# Patient Record
Sex: Female | Born: 2000 | Race: White | Hispanic: No | Smoking: Never smoker
Health system: Southern US, Community
[De-identification: ages and names within clinical notes are randomized; demographics above are authoritative.]

## PROBLEM LIST (undated history)

## (undated) DIAGNOSIS — K219 Gastro-esophageal reflux disease without esophagitis: Secondary | ICD-10-CM

## (undated) DIAGNOSIS — M419 Scoliosis, unspecified: Secondary | ICD-10-CM

## (undated) DIAGNOSIS — F419 Anxiety disorder, unspecified: Secondary | ICD-10-CM

## (undated) DIAGNOSIS — E669 Obesity, unspecified: Secondary | ICD-10-CM

## (undated) DIAGNOSIS — R011 Cardiac murmur, unspecified: Secondary | ICD-10-CM

## (undated) DIAGNOSIS — F32A Depression, unspecified: Secondary | ICD-10-CM

## (undated) DIAGNOSIS — F329 Major depressive disorder, single episode, unspecified: Secondary | ICD-10-CM

## (undated) HISTORY — DX: Major depressive disorder, single episode, unspecified: F32.9

## (undated) HISTORY — DX: Depression, unspecified: F32.A

## (undated) HISTORY — PX: WISDOM TOOTH EXTRACTION: SHX21

## (undated) HISTORY — DX: Anxiety disorder, unspecified: F41.9

---
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
## 2018-01-24 ENCOUNTER — Other Ambulatory Visit: Payer: Self-pay | Admitting: Maternal Newborn

## 2018-01-24 ENCOUNTER — Telehealth: Payer: Self-pay | Admitting: Maternal Newborn

## 2018-01-24 DIAGNOSIS — Z308 Encounter for other contraceptive management: Secondary | ICD-10-CM

## 2018-01-24 MED ORDER — NORGESTIM-ETH ESTRAD TRIPHASIC 0.18/0.215/0.25 MG-35 MCG PO TABS: 1.0000 | ORAL_TABLET | Freq: Every day | ORAL | 12 refills | Status: DC

## 2018-01-24 NOTE — Telephone Encounter (Signed)
Patient needs refill on her birth control Trisprintec.  She needs to start her new pack on Sunday.  [facility].

## 2018-06-10 ENCOUNTER — Ambulatory Visit (INDEPENDENT_AMBULATORY_CARE_PROVIDER_SITE_OTHER): Payer: Self-pay

## 2018-06-10 DIAGNOSIS — Z23 Encounter for immunization: Secondary | ICD-10-CM

## 2018-09-16 ENCOUNTER — Ambulatory Visit (INDEPENDENT_AMBULATORY_CARE_PROVIDER_SITE_OTHER): Payer: Self-pay | Admitting: Obstetrics and Gynecology

## 2018-09-16 ENCOUNTER — Ambulatory Visit: Payer: Self-pay | Admitting: Obstetrics and Gynecology

## 2018-09-16 ENCOUNTER — Encounter: Payer: Self-pay | Admitting: Obstetrics and Gynecology

## 2018-09-16 VITALS — BP 112/74 | Ht 60.0 in | Wt 135.0 lb

## 2018-09-16 DIAGNOSIS — N3001 Acute cystitis with hematuria: Secondary | ICD-10-CM

## 2018-09-16 MED ORDER — SULFAMETHOXAZOLE-TRIMETHOPRIM 800-160 MG PO TABS: 1.0000 | ORAL_TABLET | Freq: Two times a day (BID) | ORAL | 0 refills | Status: AC

## 2018-09-16 NOTE — Progress Notes (Signed)
Obstetrics & Gynecology Office Visit   Chief Complaint  Patient presents with  . Dysuria    History of Present Illness: Urinary Tract Infection: Patient complains of burning with urination, dysuria, foul smelling urine, frequency and hematuria She has had symptoms for 1 day. Patient also complains of no other symptoms. Patient denies back pain, fever and vaginal discharge. Patient does not have a history of recurrent UTI.  Patient does not have a history of pyelonephritis.   Past Medical History:  Diagnosis Date  . Anxiety   . Depression     Past Surgical History:  Procedure Laterality Date  . CHOLECYSTECTOMY N/A 01/11/2015   Procedure: LAPAROSCOPIC CHOLECYSTECTOMY WITH INTRAOPERATIVE CHOLANGIOGRAM;  Surgeon: Earline Mayotte, MD;  Location: [facility] OR[facility];  Service: General;  Laterality: N/A;    Gynecologic History: Patient's last menstrual period was 08/26/2018.  Obstetric History: G0  Family History  Problem Relation Age of Onset  . Diabetes Mother   . Cholelithiasis Mother   . Cholelithiasis Paternal Grandfather     Social History   Socioeconomic History  . Marital status: Single    Spouse name: Not on file  . Number of children: Not on file  . Years of education: Not on file  . Highest education level: Not on file  Occupational History  . Not on file  Social Needs  . Financial resource strain: Not on file  . Food insecurity:    Worry: Not on file    Inability: Not on file  . Transportation needs:    Medical: Not on file    Non-medical: Not on file  Tobacco Use  . Smoking status: Never Smoker  . Smokeless tobacco: Never Used  Substance and Sexual Activity  . Alcohol use: No    Alcohol/week: 0.0 standard drinks  . Drug use: No  . Sexual activity: Not on file  Lifestyle  . Physical activity:    Days per week: Not on file    Minutes per session: Not on file  . Stress: Not on file  Relationships  . Social connections:    Talks on phone: Not on file   Gets together: Not on file    Attends religious service: Not on file    Active member of club or organization: Not on file    Attends meetings of clubs or organizations: Not on file    Relationship status: Not on file  . Intimate partner violence:    Fear of current or ex partner: Not on file    Emotionally abused: Not on file    Physically abused: Not on file    Forced sexual activity: Not on file  Other Topics Concern  . Not on file  Social History Narrative  . Not on file    No Known Allergies  Prior to Admission medications   Medication Sig Start Date End Date Taking? Authorizing Provider  busPIRone (BUSPAR) 15 MG tablet  06/24/18  Yes [provider]  esomeprazole (NEXIUM) 20 MG capsule Take 20 mg by mouth daily at 12 noon.   Yes [provider]  hydrOXYzine (ATARAX/VISTARIL) 25 MG tablet  06/24/18  Yes [provider]  Norgestimate-Ethinyl Estradiol Triphasic (TRI-SPRINTEC) 0.18/0.215/0.25 MG-35 MCG tablet Take 1 tablet by mouth daily. 01/24/18  Yes Oswaldo Conroy, CNM  venlafaxine XR (EFFEXOR-XR) 75 MG 24 hr capsule Take 75 mg by mouth daily with breakfast.   Yes [provider]  Norgestimate-Ethinyl Estradiol Triphasic (TRI-SPRINTEC) 0.18/0.215/0.25 MG-35 MCG tablet once daily. 03/02/15  [provider]    Review of Systems  Constitutional: Negative.  Negative for chills and fever.  HENT: Negative.   Eyes: Negative.   Respiratory: Negative.   Cardiovascular: Negative.   Gastrointestinal: Negative.   Genitourinary: Positive for dysuria, frequency and hematuria. Negative for flank pain and urgency.  Musculoskeletal: Negative.  Negative for back pain.  Skin: Negative.   Neurological: Negative.   Psychiatric/Behavioral: Negative.      Physical Exam BP 112/74   Ht 5' (1.524 m)   Wt 135 lb (61.2 kg)   LMP 08/26/2018   BMI 26.37 kg/m  Patient's last menstrual period was 08/26/2018. Physical Exam Constitutional:       General: She is not in acute distress.    Appearance: Normal appearance.  HENT:     Head: Normocephalic and atraumatic.  Eyes:     General: No scleral icterus.    Conjunctiva/sclera: Conjunctivae normal.  Abdominal:     General: There is no distension.     Palpations: Abdomen is soft. There is no mass.     Tenderness: There is no abdominal tenderness. There is no right CVA tenderness, left CVA tenderness, guarding or rebound.  Neurological:     General: No focal deficit present.     Mental Status: She is alert and oriented to person, place, and time.     Cranial Nerves: No cranial nerve deficit.  Psychiatric:        Mood and Affect: Mood normal.        Behavior: Behavior normal.        Judgment: Judgment normal.    Urinalysis (pertinents): Leukocyte esterase: 3+ Nitrites: 3+ Blood: large  Assessment: 18 y.o. No obstetric history on file. female here for  1. Acute cystitis with hematuria      Plan: Problem List Items Addressed This Visit    None    Visit Diagnoses    Acute cystitis with hematuria    -  Primary   Relevant Medications   sulfamethoxazole-trimethoprim (BACTRIM DS) 800-160 MG tablet     UTI. Not enough urine for urine culture.  Will send for culture, if she does not have improvement.   Thomasene Mohair, MD 09/16/2018 1:27 PM

## 2019-01-13 ENCOUNTER — Other Ambulatory Visit: Payer: Self-pay | Admitting: Maternal Newborn

## 2019-01-13 ENCOUNTER — Telehealth: Payer: Self-pay | Admitting: Obstetrics and Gynecology

## 2019-01-13 DIAGNOSIS — Z308 Encounter for other contraceptive management: Secondary | ICD-10-CM

## 2019-01-13 MED ORDER — NORGESTIM-ETH ESTRAD TRIPHASIC 0.18/0.215/0.25 MG-35 MCG PO TABS: 1.0000 | ORAL_TABLET | Freq: Every day | ORAL | 1 refills | Status: DC

## 2019-01-13 NOTE — Telephone Encounter (Signed)
I did send a refill, but she should come in for an annual within the next 2 months if possible.

## 2019-01-13 NOTE — Telephone Encounter (Signed)
Pt needs refill on BC trisprintec to [facility]

## 2019-01-13 NOTE — Telephone Encounter (Signed)
RF or does pt need to come in for annual?

## 2019-01-13 NOTE — Progress Notes (Signed)
Refill for OCP, patient needs to be scheduled for an annual exam.

## 2019-02-27 ENCOUNTER — Other Ambulatory Visit: Payer: Self-pay | Admitting: Maternal Newborn

## 2019-02-27 DIAGNOSIS — Z308 Encounter for other contraceptive management: Secondary | ICD-10-CM

## 2019-03-06 ENCOUNTER — Other Ambulatory Visit: Payer: Self-pay

## 2019-03-06 ENCOUNTER — Other Ambulatory Visit: Payer: Self-pay | Admitting: Maternal Newborn

## 2019-03-06 DIAGNOSIS — Z308 Encounter for other contraceptive management: Secondary | ICD-10-CM

## 2019-03-06 MED ORDER — NORGESTIM-ETH ESTRAD TRIPHASIC 0.18/0.215/0.25 MG-35 MCG PO TABS: 1.0000 | ORAL_TABLET | Freq: Every day | ORAL | 0 refills | Status: DC

## 2019-03-16 ENCOUNTER — Ambulatory Visit: Payer: Self-pay | Admitting: Obstetrics and Gynecology

## 2019-03-16 NOTE — Progress Notes (Deleted)
PCP:  Lawernce PittsHuprich, Erin E, NP   No chief complaint on file.    HPI:      Ms. Sharon Kelly is a 18 y.o. No obstetric history on file. who LMP was No LMP recorded., presents today for her annual examination.  Her menses are {norm/abn:715}, lasting {number:22536} days.  Dysmenorrhea {dysmen:716}. She {does:18564} have intermenstrual bleeding.  Sex activity: {sex active:315163}.  Last Pap: N/A Hx of STDs: {STD hx:14358}  There is no FH of breast cancer. There is no FH of ovarian cancer. The patient {does:18564} do self-breast exams.  Tobacco use: {tob:20664} Alcohol use: {Alcohol:11675} No drug use.  Exercise: {exercise:31265}  She {does:18564} get adequate calcium and Vitamin D in her diet.   Past Medical History:  Diagnosis Date  . Anxiety   . Depression     Past Surgical History:  Procedure Laterality Date  . CHOLECYSTECTOMY N/A 01/11/2015   Procedure: LAPAROSCOPIC CHOLECYSTECTOMY WITH INTRAOPERATIVE CHOLANGIOGRAM;  Surgeon: Earline MayotteJeffrey W Byrnett, MD;  Location: [facility] ORS;  Service: General;  Laterality: N/A;    Family History  Problem Relation Age of Onset  . Diabetes Mother   . Cholelithiasis Mother   . Cholelithiasis Paternal Grandfather     Social History   Socioeconomic History  . Marital status: Single    Spouse name: Not on file  . Number of children: Not on file  . Years of education: Not on file  . Highest education level: Not on file  Occupational History  . Not on file  Social Needs  . Financial resource strain: Not on file  . Food insecurity    Worry: Not on file    Inability: Not on file  . Transportation needs    Medical: Not on file    Non-medical: Not on file  Tobacco Use  . Smoking status: Never Smoker  . Smokeless tobacco: Never Used  Substance and Sexual Activity  . Alcohol use: No    Alcohol/week: 0.0 standard drinks  . Drug use: No  . Sexual activity: Not on file  Lifestyle  . Physical activity    Days per week: Not on file   Minutes per session: Not on file  . Stress: Not on file  Relationships  . Social Musicianconnections    Talks on phone: Not on file    Gets together: Not on file    Attends religious service: Not on file    Active member of club or organization: Not on file    Attends meetings of clubs or organizations: Not on file    Relationship status: Not on file  . Intimate partner violence    Fear of current or ex partner: Not on file    Emotionally abused: Not on file    Physically abused: Not on file    Forced sexual activity: Not on file  Other Topics Concern  . Not on file  Social History Narrative  . Not on file    Outpatient Medications Prior to Visit  Medication Sig Dispense Refill  . busPIRone (BUSPAR) 15 MG tablet     . esomeprazole (NEXIUM) 20 MG capsule Take 20 mg by mouth daily at 12 noon.    . hydrOXYzine (ATARAX/VISTARIL) 25 MG tablet     . Norgestimate-Ethinyl Estradiol Triphasic (TRI-SPRINTEC) 0.18/0.215/0.25 MG-35 MCG tablet Take 1 tablet by mouth daily. 28 tablet 0  . venlafaxine XR (EFFEXOR-XR) 75 MG 24 hr capsule Take 75 mg by mouth daily with breakfast.     No facility-administered medications prior  to visit.       ROS:  Review of Systems BREAST: No symptoms   Objective: There were no vitals taken for this visit.   OBGyn Exam  Results: No results found for this or any previous visit (from the past 24 hour(s)).  Assessment/Plan: No diagnosis found.  No orders of the defined types were placed in this encounter.            GYN counsel {counseling:16159}     F/U  No follow-ups on file.  Bartosz Luginbill B. Bow Buntyn, PA-C 03/16/2019 9:56 AM

## 2019-04-15 ENCOUNTER — Other Ambulatory Visit: Payer: Self-pay | Admitting: Obstetrics and Gynecology

## 2019-04-15 DIAGNOSIS — Z308 Encounter for other contraceptive management: Secondary | ICD-10-CM

## 2019-04-20 ENCOUNTER — Ambulatory Visit: Payer: Self-pay | Admitting: Obstetrics and Gynecology

## 2019-04-20 ENCOUNTER — Other Ambulatory Visit: Payer: Self-pay | Admitting: Obstetrics and Gynecology

## 2019-04-20 MED ORDER — VENLAFAXINE HCL ER 75 MG PO CP24: 150.0000 mg | ORAL_CAPSULE | Freq: Every day | ORAL | 0 refills | Status: DC

## 2019-04-20 NOTE — Progress Notes (Signed)
Rx RF effexor one time to [facility]. Has psych appt 05/12/19 but psych won't RF till then. Pt had to reschedule annual today due to GI sx.

## 2019-04-27 NOTE — Progress Notes (Signed)
PCP:  Lawernce PittsHuprich, Erin E, NP   Chief Complaint  Patient presents with  . Gynecologic Exam     HPI:      Ms. Sharon Kelly is a 18 y.o. No obstetric history on file. who LMP was Patient's last menstrual period was 03/25/2019 (approximate)., presents today for her annual examination.  Her menses are regular every 28-30 days, lasting 5 days.  Dysmenorrhea mild, ibup improves sx. She does not have intermenstrual bleeding.  Sex activity: never sexually active.  Hx of STDs: none  There is a FH of breast cancer in her pat grt aunt, genetic testing not indicated. There is no FH of ovarian cancer. The patient does not do self-breast exams.  Tobacco use: The patient denies current or previous tobacco use. Alcohol use: none No drug use.  Exercise: moderately active  She does get adequate calcium but not Vitamin D in her diet.  Did 2 Gardasil injections but then recalled, so not doing the 3rd.   Pt seeing Dr. Maryruth BunKapur for anxiety/depression. Has appt in 2 wks; needed RF on effexor last wk and she wouldn't send in. I sent in 1 mo RF for her. Pt now needs RF on buspar until her appt. Takes at night to sleep.  Pt complains of episodic all over muscle aches, not improved with NSAIDs. Sx are random but usually last 4 days. Also associates migraine headache with them. Wonders if she has fibromyalgia. Has trouble staying asleep but still gets about 8-9 hrs of sleep nightly. Has appt with PCP 10/20.  Past Medical History:  Diagnosis Date  . Anxiety   . Depression     Past Surgical History:  Procedure Laterality Date  . CHOLECYSTECTOMY N/A 01/11/2015   Procedure: LAPAROSCOPIC CHOLECYSTECTOMY WITH INTRAOPERATIVE CHOLANGIOGRAM;  Surgeon: Earline MayotteJeffrey W Byrnett, MD;  Location: [facility] OR[facility];  Service: General;  Laterality: N/A;    Family History  Problem Relation Age of Onset  . Diabetes Mother   . Cholelithiasis Mother   . Cholelithiasis Paternal Grandfather     Social History   Socioeconomic History   . Marital status: Single    Spouse name: Not on file  . Number of children: Not on file  . Years of education: Not on file  . Highest education level: Not on file  Occupational History  . Not on file  Social Needs  . Financial resource strain: Not on file  . Food insecurity    Worry: Not on file    Inability: Not on file  . Transportation needs    Medical: Not on file    Non-medical: Not on file  Tobacco Use  . Smoking status: Never Smoker  . Smokeless tobacco: Never Used  Substance and Sexual Activity  . Alcohol use: No    Alcohol/week: 0.0 standard drinks  . Drug use: No  . Sexual activity: Never    Birth control/protection: Pill  Lifestyle  . Physical activity    Days per week: Not on file    Minutes per session: Not on file  . Stress: Not on file  Relationships  . Social Musicianconnections    Talks on phone: Not on file    Gets together: Not on file    Attends religious service: Not on file    Active member of club or organization: Not on file    Attends meetings of clubs or organizations: Not on file    Relationship status: Not on file  . Intimate partner violence    Fear  of current or ex partner: Not on file    Emotionally abused: Not on file    Physically abused: Not on file    Forced sexual activity: Not on file  Other Topics Concern  . Not on file  Social History Narrative  . Not on file     Current Outpatient Medications:  .  busPIRone (BUSPAR) 15 MG tablet, Take 1 tablet (15 mg total) by mouth at bedtime., Disp: 30 tablet, Rfl: 0 .  esomeprazole (NEXIUM) 20 MG capsule, Take 20 mg by mouth daily at 12 noon., Disp: , Rfl:  .  hydrOXYzine (ATARAX/VISTARIL) 25 MG tablet, , Disp: , Rfl:  .  Norgestimate-Ethinyl Estradiol Triphasic (TRI-SPRINTEC) 0.18/0.215/0.25 MG-35 MCG tablet, Take 1 tablet by mouth daily., Disp: 84 tablet, Rfl: 3 .  venlafaxine XR (EFFEXOR-XR) 75 MG 24 hr capsule, Take 2 capsules (150 mg total) by mouth daily with breakfast., Disp: 60  capsule, Rfl: 0     ROS:  Review of Systems  Constitutional: Positive for fatigue. Negative for fever and unexpected weight change.  Respiratory: Negative for cough, shortness of breath and wheezing.   Cardiovascular: Negative for chest pain, palpitations and leg swelling.  Gastrointestinal: Positive for diarrhea. Negative for blood in stool, constipation, nausea and vomiting.  Endocrine: Negative for cold intolerance, heat intolerance and polyuria.  Genitourinary: Negative for dyspareunia, dysuria, flank pain, frequency, genital sores, hematuria, menstrual problem, pelvic pain, urgency, vaginal bleeding, vaginal discharge and vaginal pain.  Musculoskeletal: Positive for arthralgias. Negative for back pain, joint swelling and myalgias.  Skin: Negative for rash.  Neurological: Negative for dizziness, syncope, light-headedness, numbness and headaches.  Hematological: Negative for adenopathy.  Psychiatric/Behavioral: Positive for agitation and decreased concentration. Negative for confusion, sleep disturbance and suicidal ideas. The patient is not nervous/anxious.   BREAST: No symptoms   Objective: BP 120/80   Ht 5\' 1"  (1.549 m)   Wt 151 lb (68.5 kg)   LMP 03/25/2019 (Approximate)   BMI 28.53 kg/m    Physical Exam Constitutional:      Appearance: She is well-developed.  Neck:     Musculoskeletal: Normal range of motion.  Pulmonary:     Effort: Pulmonary effort is normal.  Musculoskeletal: Normal range of motion.  Neurological:     General: No focal deficit present.     Mental Status: She is alert and oriented to person, place, and time.     Cranial Nerves: No cranial nerve deficit.     Sensory: No sensory deficit.  Skin:    General: Skin is warm.  Psychiatric:        Mood and Affect: Mood normal.        Behavior: Behavior normal.        Thought Content: Thought content normal.        Judgment: Judgment normal.     Assessment/Plan: Encounter for annual routine  gynecological examination  Encounter for surveillance of contraceptive pills - Plan: Norgestimate-Ethinyl Estradiol Triphasic (TRI-SPRINTEC) 0.18/0.215/0.25 MG-35 MCG tablet; OCP RF  Prescription refill - Plan: busPIRone (BUSPAR) 15 MG tablet; Rx RF until pt's appt with Dr. Nicolasa Ducking in a couple wks  Meds ordered this encounter  Medications  . busPIRone (BUSPAR) 15 MG tablet    Sig: Take 1 tablet (15 mg total) by mouth at bedtime.    Dispense:  30 tablet    Refill:  0    Order Specific Question:   Supervising Provider    Answer:   Gae Dry [id number]  . Norgestimate-Ethinyl Estradiol  Triphasic (TRI-SPRINTEC) 0.18/0.215/0.25 MG-35 MCG tablet    Sig: Take 1 tablet by mouth daily.    Dispense:  84 tablet    Refill:  3    Order Specific Question:   Supervising Provider    Answer:   Nadara Mustard [[id number]]             GYN counsel adequate intake of calcium and vitamin D, diet and exercise     F/U  Return in about 1 year (around 04/27/2020).  Syan Cullimore B. Woodruff Skirvin, PA-C 04/28/2019 10:48 AM

## 2019-04-28 ENCOUNTER — Encounter: Payer: Self-pay | Admitting: Obstetrics and Gynecology

## 2019-04-28 ENCOUNTER — Ambulatory Visit (INDEPENDENT_AMBULATORY_CARE_PROVIDER_SITE_OTHER): Payer: Managed Care, Other (non HMO) | Admitting: Obstetrics and Gynecology

## 2019-04-28 ENCOUNTER — Other Ambulatory Visit: Payer: Self-pay

## 2019-04-28 VITALS — BP 120/80 | Ht 61.0 in | Wt 151.0 lb

## 2019-04-28 DIAGNOSIS — Z3041 Encounter for surveillance of contraceptive pills: Secondary | ICD-10-CM

## 2019-04-28 DIAGNOSIS — Z01419 Encounter for gynecological examination (general) (routine) without abnormal findings: Secondary | ICD-10-CM

## 2019-04-28 DIAGNOSIS — Z760 Encounter for issue of repeat prescription: Secondary | ICD-10-CM

## 2019-04-28 MED ORDER — NORGESTIM-ETH ESTRAD TRIPHASIC 0.18/0.215/0.25 MG-35 MCG PO TABS: 1.0000 | ORAL_TABLET | Freq: Every day | ORAL | 3 refills | Status: DC

## 2019-04-28 MED ORDER — BUSPIRONE HCL 15 MG PO TABS: 15.0000 mg | ORAL_TABLET | Freq: Every day | ORAL | 0 refills | Status: DC

## 2019-04-28 NOTE — Patient Instructions (Signed)
I value your feedback and entrusting us with your care. If you get a [facility] patient survey, I would appreciate you taking the time to let us know about your experience today. Thank you! 

## 2019-05-16 ENCOUNTER — Other Ambulatory Visit: Payer: Self-pay | Admitting: Obstetrics and Gynecology

## 2019-05-25 ENCOUNTER — Other Ambulatory Visit: Payer: Self-pay | Admitting: Obstetrics and Gynecology

## 2019-05-25 DIAGNOSIS — Z760 Encounter for issue of repeat prescription: Secondary | ICD-10-CM

## 2019-06-08 ENCOUNTER — Ambulatory Visit (INDEPENDENT_AMBULATORY_CARE_PROVIDER_SITE_OTHER): Payer: Managed Care, Other (non HMO) | Admitting: Family Medicine

## 2019-06-08 ENCOUNTER — Other Ambulatory Visit: Payer: Self-pay

## 2019-06-08 ENCOUNTER — Encounter: Payer: Self-pay | Admitting: Family Medicine

## 2019-06-08 VITALS — BP 112/76 | HR 102 | Temp 97.5°F | Resp 16 | Ht 62.0 in | Wt 155.9 lb

## 2019-06-08 DIAGNOSIS — F419 Anxiety disorder, unspecified: Secondary | ICD-10-CM

## 2019-06-08 DIAGNOSIS — R5383 Other fatigue: Secondary | ICD-10-CM

## 2019-06-08 DIAGNOSIS — F329 Major depressive disorder, single episode, unspecified: Secondary | ICD-10-CM

## 2019-06-08 DIAGNOSIS — Z114 Encounter for screening for human immunodeficiency virus [HIV]: Secondary | ICD-10-CM

## 2019-06-08 DIAGNOSIS — Z1159 Encounter for screening for other viral diseases: Secondary | ICD-10-CM | POA: Diagnosis not present

## 2019-06-08 DIAGNOSIS — M791 Myalgia, unspecified site: Secondary | ICD-10-CM

## 2019-06-08 DIAGNOSIS — Z13220 Encounter for screening for lipoid disorders: Secondary | ICD-10-CM

## 2019-06-08 NOTE — Progress Notes (Signed)
Name: Sharon Kelly   MRN: [id number]    DOB: Oct 07, 2000   Date:06/08/2019       Progress Note  Subjective  Chief Complaint  Chief Complaint  Patient presents with  . Establish Care  . Depression  . Anxiety    HPI  Pt presents to establish care and for the following:  Depression and Anxiety: She has been seeing Dr. Maryruth Bun, but does not want to return - had started in October 2018.  She has been taking effexor 150mg  XR, buspar 15mg  BID, and hydroxyzine QHS PRN.  Has tried melatonin, CBD and still has trouble waking up in the middle of the night.  Prior psychiatric medications include Lexapro. She requests referral to new psychiatrist today to discuss her dog being certified as a . She denies SI/HI, PHQ9 is elevated/positive today. Depression screen PHQ 2/9 06/08/2019  Decreased Interest 1  Down, Depressed, Hopeless 1  PHQ - 2 Score 2  Altered sleeping 3  Tired, decreased energy 1  Change in appetite 0  Feeling bad or failure about yourself  0  Trouble concentrating 0  Moving slowly or fidgety/restless 1  PHQ-9 Score 7  Difficult doing work/chores Not difficult at all   Myalgias: She notes that she has had ongoing muscle pain.  She has talked to 4/9 with GYN about this and was told to keep a log, which she has done and brings in today - she notes that she initially had high pain when she was out of her psychiatric medications and had significant nausea and pain.  She restarted her meds and the pain did decrease for a week or so, but then came back up; nausea is still occasional (she thinks this is more diet and/or migraine related).  There is no real pattern that she has noticed aside from weather changes.  She notes that her pain started in high school - initially thought to be related to dancing, when she stopped dance, her pain has continued.  She has done some research and thinks she may have FMS.  Patient Active Problem List   Diagnosis Date  Noted  . Eructation 07/13/2015  . Nausea 03/02/2015    Past Surgical History:  Procedure Laterality Date  . CHOLECYSTECTOMY N/A 01/11/2015   Procedure: LAPAROSCOPIC CHOLECYSTECTOMY WITH INTRAOPERATIVE CHOLANGIOGRAM;  Surgeon: 03/04/2015, MD;  Location: [facility] ORS;  Service: General;  Laterality: N/A;    Family History  Problem Relation Age of Onset  . Diabetes Mother   . Cholelithiasis Mother   . Cholelithiasis Paternal Grandfather     Social History   Socioeconomic History  . Marital status: Single    Spouse name: Not on file  . Number of children: Not on file  . Years of education: Not on file  . Highest education level: Not on file  Occupational History  . Occupation: fulltrime student  Social Needs  . Financial resource strain: Not hard at all  . Food insecurity    Worry: Never true    Inability: Never true  . Transportation needs    Medical: No    Non-medical: No  Tobacco Use  . Smoking status: Never Smoker  . Smokeless tobacco: Never Used  Substance and Sexual Activity  . Alcohol use: No    Alcohol/week: 0.0 standard drinks  . Drug use: No  . Sexual activity: Never    Partners: Male    Birth control/protection: Pill  Lifestyle  . Physical activity  Days per week: 2 days    Minutes per session: 30 min  . Stress: Not at all  Relationships  . Social connections    Talks on phone: More than three times a week    Gets together: Never    Attends religious service: More than 4 times per year    Active member of club or organization: No    Attends meetings of clubs or organizations: Never    Relationship status: Never married  . Intimate partner violence    Fear of current or ex partner: No    Emotionally abused: No    Physically abused: No    Forced sexual activity: No  Other Topics Concern  . Not on file  Social History Narrative   [employer] Full time Student will transfer to [employer] in 2021 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     Current Outpatient  Medications:  .  busPIRone (BUSPAR) 15 MG tablet, Take 1 tablet (15 mg total) by mouth at bedtime., Disp: 30 tablet, Rfl: 0 .  esomeprazole (NEXIUM) 20 MG capsule, Take 20 mg by mouth daily at 12 noon., Disp: , Rfl:  .  hydrOXYzine (ATARAX/VISTARIL) 25 MG tablet, , Disp: , Rfl:  .  Norgestimate-Ethinyl Estradiol Triphasic (TRI-SPRINTEC) 0.18/0.215/0.25 MG-35 MCG tablet, Take 1 tablet by mouth daily., Disp: 84 tablet, Rfl: 3 .  venlafaxine XR (EFFEXOR-XR) 75 MG 24 hr capsule, Take 2 capsules (150 mg total) by mouth daily with breakfast., Disp: 60 capsule, Rfl: 0  No Known Allergies  I personally reviewed active problem list, medication list, allergies, health maintenance, notes from last encounter, lab results with the patient/caregiver today.   ROS  Ten systems reviewed and is negative except as mentioned in HPI  Objective  Vitals:   06/08/19 1123  BP: 112/76  Pulse: (!) 102  Resp: 16  Temp: (!) 97.5 F (36.4 C)  TempSrc: Temporal  SpO2: 95%  Weight: 155 lb 14.4 oz (70.7 kg)  Height: 5\' 2"  (1.575 m)   Body mass index is 28.51 kg/m.  Physical Exam  Constitutional: Patient appears well-developed and well-nourished. No distress.  HENT: Head: Normocephalic and atraumatic.  Eyes: Conjunctivae and EOM are normal. No scleral icterus.   Neck: Normal range of motion. Neck supple. No JVD present. Cardiovascular: Normal rate, regular rhythm and normal heart sounds.  No murmur heard. No BLE edema. Pulmonary/Chest: Effort normal and breath sounds normal. No respiratory distress. Musculoskeletal: Normal range of motion, no joint effusions. No gross deformities Neurological: Pt is alert and oriented to person, place, and time. No cranial nerve deficit. Coordination, balance, strength, speech and gait are normal.  Skin: Skin is warm and dry. No rash noted. No erythema.  Psychiatric: Patient has a normal mood and affect. behavior is normal. Judgment and thought content normal.  No  results found for this or any previous visit (from the past 72 hour(s)).  PHQ2/9: Depression screen PHQ 2/9 06/08/2019  Decreased Interest 1  Down, Depressed, Hopeless 1  PHQ - 2 Score 2  Altered sleeping 3  Tired, decreased energy 1  Change in appetite 0  Feeling bad or failure about yourself  0  Trouble concentrating 0  Moving slowly or fidgety/restless 1  PHQ-9 Score 7  Difficult doing work/chores Not difficult at all   PHQ-2/9 Result is positive.    Fall Risk: Fall Risk  06/08/2019  Falls in the past year? 0  Number falls in past yr: 0  Injury with Fall? 0  Follow up Falls evaluation completed  Assessment & Plan  1. Anxiety and depression - Will try to obtain new psychiatrist for her treatment at this time - Ambulatory referral to Psychiatry  2. Myalgia - Will evaluate for inflammatory disorders and rheumatologic disorders.  If these are excluded, will proceed with FMS as potential diagnosis; may consider cymbalta as an option for care in the future. - CBC with Differential/Platelet - COMPLETE METABOLIC PANEL WITH GFR - Thyroid Panel With TSH - C-reactive protein - Sedimentation rate - ANA,IFA RA Diag Pnl w/rflx Tit/Patn  3. Fatigue, unspecified type - CBC with Differential/Platelet - COMPLETE METABOLIC PANEL WITH GFR - Thyroid Panel With TSH - C-reactive protein - Sedimentation rate - ANA,IFA RA Diag Pnl w/rflx Tit/Patn  4. Need for hepatitis C screening test - Hepatitis C antibody  5. Screening for HIV without presence of risk factors - HIV Antibody (routine testing w rflx)  6. Lipid screening - Lipid panel

## 2019-06-08 NOTE — Patient Instructions (Signed)
Cymbalta and Amitriptyline - 2 medications that can sometimes help with FMS. Look these up.  We will wait on your labs prior to starting anything new, but we may want to consider these at some point.

## 2019-06-10 ENCOUNTER — Other Ambulatory Visit: Payer: Self-pay | Admitting: Family Medicine

## 2019-06-10 DIAGNOSIS — R768 Other specified abnormal immunological findings in serum: Secondary | ICD-10-CM

## 2019-06-10 DIAGNOSIS — R5383 Other fatigue: Secondary | ICD-10-CM

## 2019-06-10 DIAGNOSIS — M791 Myalgia, unspecified site: Secondary | ICD-10-CM

## 2019-06-10 LAB — COMPLETE METABOLIC PANEL WITH GFR
AG Ratio: 1.3 (calc) (ref 1.0–2.5)
ALT: 18 U/L (ref 6–29)
AST: 16 U/L (ref 10–30)
Albumin: 3.9 g/dL (ref 3.6–5.1)
Alkaline phosphatase (APISO): 55 U/L (ref 31–125)
BUN/Creatinine Ratio: 9 (calc) (ref 6–22)
BUN: 6 mg/dL — ABNORMAL LOW (ref 7–25)
CO2: 27 mmol/L (ref 20–32)
Calcium: 9 mg/dL (ref 8.6–10.2)
Chloride: 105 mmol/L (ref 98–110)
Creat: 0.67 mg/dL (ref 0.50–1.10)
GFR, Est African American: 147 mL/min/{1.73_m2} (ref >=60)
GFR, Est Non African American: 127 mL/min/{1.73_m2} (ref >=60)
Globulin: 3 g/dL (calc) (ref 1.9–3.7)
Glucose, Bld: 97 mg/dL (ref 65–99)
Potassium: 4.3 mmol/L (ref 3.5–5.3)
Sodium: 138 mmol/L (ref 135–146)
Total Bilirubin: 0.2 mg/dL (ref 0.2–1.2)
Total Protein: 6.9 g/dL (ref 6.1–8.1)

## 2019-06-10 LAB — THYROID PANEL WITH TSH
Free Thyroxine Index: 2.2 (ref 1.4–3.8)
T3 Uptake: 21 % — ABNORMAL LOW (ref 22–35)
T4, Total: 10.4 ug/dL (ref 5.3–11.7)
TSH: 1.42 mIU/L

## 2019-06-10 LAB — ANA,IFA RA DIAG PNL W/RFLX TIT/PATN
Anti Nuclear Antibody (ANA): POSITIVE — AB
Cyclic Citrullin Peptide Ab: <16 UNITS
Rheumatoid fact SerPl-aCnc: <14 IU/mL (ref <14)

## 2019-06-10 LAB — CBC WITH DIFFERENTIAL/PLATELET
Absolute Monocytes: 506 cells/uL (ref 200–950)
Basophils Absolute: 32 cells/uL (ref 0–200)
Basophils Relative: 0.5 %
Eosinophils Absolute: 77 cells/uL (ref 15–500)
Eosinophils Relative: 1.2 %
HCT: 33.8 % — ABNORMAL LOW (ref 35.0–45.0)
Hemoglobin: 10.7 g/dL — ABNORMAL LOW (ref 11.7–15.5)
Lymphs Abs: 2976 cells/uL (ref 850–3900)
MCH: 24.3 pg — ABNORMAL LOW (ref 27.0–33.0)
MCHC: 31.7 g/dL — ABNORMAL LOW (ref 32.0–36.0)
MCV: 76.6 fL — ABNORMAL LOW (ref 80.0–100.0)
MPV: 11.4 fL (ref 7.5–12.5)
Monocytes Relative: 7.9 %
Neutro Abs: 2810 cells/uL (ref 1500–7800)
Neutrophils Relative %: 43.9 %
Platelets: 292 10*3/uL (ref 140–400)
RBC: 4.41 10*6/uL (ref 3.80–5.10)
RDW: 14.5 % (ref 11.0–15.0)
Total Lymphocyte: 46.5 %
WBC: 6.4 10*3/uL (ref 3.8–10.8)

## 2019-06-10 LAB — ANTI-NUCLEAR AB-TITER (ANA TITER): ANA Titer 1: 1:40 {titer} — ABNORMAL HIGH

## 2019-06-10 LAB — C-REACTIVE PROTEIN: CRP: 3.3 mg/L (ref <8.0)

## 2019-06-10 LAB — LIPID PANEL
Cholesterol: 169 mg/dL (ref <200)
HDL: 54 mg/dL (ref >=50)
LDL Cholesterol (Calc): 93 mg/dL (calc)
Non-HDL Cholesterol (Calc): 115 mg/dL (calc) (ref <130)
Total CHOL/HDL Ratio: 3.1 (calc) (ref <5.0)
Triglycerides: 121 mg/dL (ref <150)

## 2019-06-10 LAB — HEPATITIS C ANTIBODY
Hepatitis C Ab: NONREACTIVE
SIGNAL TO CUT-OFF: 0.03 (ref <1.00)

## 2019-06-10 LAB — HIV ANTIBODY (ROUTINE TESTING W REFLEX): HIV 1&2 Ab, 4th Generation: NONREACTIVE

## 2019-06-10 LAB — SEDIMENTATION RATE: Sed Rate: 14 mm/h (ref 0–20)

## 2019-06-22 ENCOUNTER — Ambulatory Visit (INDEPENDENT_AMBULATORY_CARE_PROVIDER_SITE_OTHER): Payer: Managed Care, Other (non HMO)

## 2019-06-22 ENCOUNTER — Other Ambulatory Visit: Payer: Self-pay

## 2019-06-22 DIAGNOSIS — Z23 Encounter for immunization: Secondary | ICD-10-CM

## 2019-07-01 NOTE — Progress Notes (Signed)
Virtual Visit via Video Note  I connected with Sharon Kelly on 07/15/19 at  8:15 AM EST by a video enabled telemedicine application and verified that I am speaking with the correct person using two identifiers.  Location: Patient: Home Provider: Clinic  This service was conducted via virtual visit.  Both audio and visual tools were used.  The patient was located at home. I was located in my office.  Consent was obtained prior to the virtual visit and is aware of possible charges through their insurance for this visit.  The patient is an established patient.  Dr. Estanislado Pandy, MD conducted the virtual visit and Hazel Sams, PA-C acted as scribe during the service.  Office staff helped with scheduling follow up visits after the service was conducted.     I discussed the limitations of evaluation and management by telemedicine and the availability of in person appointments. The patient expressed understanding and agreed to proceed.  CC: Generalized myalgias  History of Present Illness: Patient is a 18 year old female with a past medical history of positive ANA, myalgia, and fatigue.  According to patient she started having pain all over her body about 3 to 4 years ago.  She states she used to dance multiple days at the time.  For the last 2 years she has not been dancing but there has been no change in her pain.  She describes pain mostly in the upper back and thoracic region in her legs.  She also describes pain in her hips which she points to trochanteric area.  She denies any discomfort in the cervical spine or thoracic spine.  She states she has no difficulty reaching her toes.  She has been suffering from insomnia, anxiety and depression for the last 4 years.  She states despite being on BuSpar hydroxyzine and melatonin she cannot sleep well.  She is establishing with a new psychiatrist and will be switching to Cymbalta from Effexor.  She states she is still continues to be under a lot of stress.   She gives history of diarrhea and constipation.  She also gives history of gastroesophageal reflux.  She states she was seen by gastroenterologist in the past and was advised treatment for gastroesophageal reflux.  She states she was never given a diagnosis of IBS.  She denies any urinary frequency.  There is no history of oral ulcers, nasal ulcers, malar rash, photosensitivity, Raynaud's phenomenon, joint swelling or lymphadenopathy.  There is no family history of autoimmune disease.  Review of Systems  Constitutional: Positive for malaise/fatigue. Negative for fever.  HENT:       +Dry mouth Denies oral or nasal ulcerations   Eyes: Negative for photophobia, pain, discharge and redness.       Denies eye dryness  Respiratory: Negative for cough, shortness of breath and wheezing.   Cardiovascular: Negative for chest pain and palpitations.  Gastrointestinal: Positive for constipation, diarrhea and heartburn. Negative for blood in stool.  Genitourinary: Negative for dysuria.  Musculoskeletal: Positive for back pain (Thoracic pain), myalgias and neck pain. Negative for joint pain.       Denies joint swelling Reports morning stiffness  Skin: Negative for rash.       Denies photosensitivity Denies color changes in fingertips    Neurological: Negative for dizziness and headaches.  Psychiatric/Behavioral: Positive for depression. The patient is nervous/anxious and has insomnia.       Observations/Objective:  Investigation: Physical Exam  Constitutional: She is oriented to person, place, and  time and well-developed, well-nourished, and in no distress.  HENT:  Head: Normocephalic and atraumatic.  Eyes: Conjunctivae are normal.  Pulmonary/Chest: Effort normal.  Neurological: She is alert and oriented to person, place, and time.  Psychiatric: Mood, memory, affect and judgment normal.   Patient reports morning stiffness for 1.5 hours.   Patient reports nocturnal pain.  Difficulty  dressing/grooming: Denies Difficulty climbing stairs: Denies Difficulty getting out of chair: Denies Difficulty using hands for taps, buttons, cutlery, and/or writing: Denies   Findings:  06/08/19: ANA 1:40 NS, RF<14, CCP<16, ESR 14, CRP 3.3, TSH 1.42, T3 21, T3 10.4, Hep C-, HIV-, lipids WNL  Component     Latest Ref Rng & Units 06/08/2019  Cholesterol     <200 mg/dL 169  HDL Cholesterol     > OR = 50 mg/dL 54  Triglycerides     <150 mg/dL 121  LDL Cholesterol (Calc)     mg/dL (calc) 93  Total CHOL/HDL Ratio     <5.0 (calc) 3.1  Non-HDL Cholesterol (Calc)     <130 mg/dL (calc) 115  T3 Uptake     22 - 35 % 21 (L)  Thyroxine (T4)     5.3 - 11.7 mcg/dL 10.4  Free Thyroxine Index     1.4 - 3.8 2.2  TSH     mIU/L 1.42  Anti Nuclear Antibody (ANA)     NEGATIVE POSITIVE (A)  RA Latex Turbid.     <46 IU/mL <80  Cyclic Citrullin Peptide Ab     UNITS <16  Interpretation        Hepatitis C Ab     NON-REACTI NON-REACTIVE  SIGNAL TO CUT-OFF     <1.00 0.03  ANA Titer 1     titer 1:40 (H)  ANA Pattern 1      Nuclear, Speckled (A)  CRP     <8.0 mg/L 3.3  Sed Rate     0 - 20 mm/h 14  HIV     NON-REACTI NON-REACTIVE     Assessment and Plan: Positive ANA (antinuclear antibody): ANA 1:40 NS, ESR 14, CRP 3.3, anti-CCP <16, RF<14, TSH 1.42: She has no clinical features of autoimmune disease at this time.  We will obtain the following lab work for thoroughness.   Myalgia: Patient complains of muscle pain for the last several years.  She had lot of trapezius spasm.  She also have thoracic pain.She was advised to try taking magnesium malate 250 mg at bedtime and if she tolerates that dose she can increase to 500 mg po at bedtime.  This raises concern of most likely myofascial pain syndrome or fibromyalgia.  Need for regular exercise was emphasized.  Other fatigue: She has chronic fatigue related to insomnia.  We discussed the importance of good sleep hygiene and regular exercise.    Other insomnia: She has interrupted sleep at night but no difficulty falling asleep.  She takes Buspar 15 mg at bedtime, Hydroxyzine 50 mg at bedtime, and melatonin at bedtime.  Good sleep hygiene was discussed.   Anxiety and depression: She is followed by a psychiatrist.  She takes Cymbalta, Buspar, and Hydroxyzine as prescribed.    History of gastroesophageal reflux (GERD): She was evaluated by a gastroenterologist in the past.  She was prescribed Nexium 20 mg 1 capsule daily, but she has not been taking it recently.   Possible IBS-patient gives history of alternating diarrhea and constipation.  Use of probiotics was discussed.  Follow Up Instructions: She will follow  up in    I discussed the assessment and treatment plan with the patient. The patient was provided an opportunity to ask questions and all were answered. The patient agreed with the plan and demonstrated an understanding of the instructions.   The patient was advised to call back or seek an in-person evaluation if the symptoms worsen or if the condition fails to improve as anticipated.  I provided 40 minutes of non-face-to-face time during this encounter.   Bo Merino, MD   Scribed by-  Hazel Sams PA-C

## 2019-07-09 ENCOUNTER — Ambulatory Visit (HOSPITAL_COMMUNITY): Payer: Self-pay | Admitting: Psychiatry

## 2019-07-13 ENCOUNTER — Encounter (HOSPITAL_COMMUNITY): Payer: Self-pay

## 2019-07-13 ENCOUNTER — Encounter (HOSPITAL_COMMUNITY): Payer: Self-pay | Admitting: Psychiatry

## 2019-07-13 ENCOUNTER — Ambulatory Visit (INDEPENDENT_AMBULATORY_CARE_PROVIDER_SITE_OTHER): Payer: 59 | Admitting: Psychiatry

## 2019-07-13 ENCOUNTER — Other Ambulatory Visit: Payer: Self-pay

## 2019-07-13 DIAGNOSIS — Z760 Encounter for issue of repeat prescription: Secondary | ICD-10-CM

## 2019-07-13 DIAGNOSIS — F3342 Major depressive disorder, recurrent, in full remission: Secondary | ICD-10-CM | POA: Insufficient documentation

## 2019-07-13 DIAGNOSIS — F411 Generalized anxiety disorder: Secondary | ICD-10-CM | POA: Diagnosis not present

## 2019-07-13 DIAGNOSIS — F331 Major depressive disorder, recurrent, moderate: Secondary | ICD-10-CM | POA: Insufficient documentation

## 2019-07-13 DIAGNOSIS — F4010 Social phobia, unspecified: Secondary | ICD-10-CM | POA: Diagnosis not present

## 2019-07-13 DIAGNOSIS — F330 Major depressive disorder, recurrent, mild: Secondary | ICD-10-CM

## 2019-07-13 MED ORDER — DULOXETINE HCL 30 MG PO CPEP: ORAL_CAPSULE | ORAL | 0 refills | Status: DC

## 2019-07-13 MED ORDER — HYDROXYZINE HCL 50 MG PO TABS: 50.0000 mg | ORAL_TABLET | Freq: Every evening | ORAL | 1 refills | Status: DC | PRN

## 2019-07-13 MED ORDER — BUSPIRONE HCL 15 MG PO TABS: 15.0000 mg | ORAL_TABLET | Freq: Two times a day (BID) | ORAL | 2 refills | Status: DC

## 2019-07-13 NOTE — Progress Notes (Signed)
Psychiatric Initial Adult Assessment   Patient Identification: Sharon Kelly MRN:  [id number] Date of Evaluation:  07/13/2019 Referral Source: Raelyn Ensign FNP Chief Complaint:   Visit Diagnosis:    ICD-10-CM   1. GAD (generalized anxiety disorder)  F41.1   2. Prescription refill  Z76.0 busPIRone (BUSPAR) 15 MG tablet  3. Social anxiety disorder  F40.10   4. Major depressive disorder, recurrent episode, mild (HCC)  F33.0   Interview was conducted using WebEx teleconferencing application and I verified that I was speaking with the correct person using two identifiers. I discussed the limitations of evaluation and management by telemedicine and  the availability of in person appointments. Patient expressed understanding and agreed to proceed.  History of Present Illness:  18 yo SWF who has been treated by Dr. Nicolasa Kelly since October 20187 for major depression and anxiety disorder. Sharon Kelly reports hx of depressed mood (improved some), generalized anxiety (excessive worrying, ruminations, racing thoughts) as well as panic attacks when she had to be in public. She has no hx of mania, psychosis, suicidal ideation or psychiatric admissions., She does not abuse alcohol or street drugs. She has been on  Venlafaxine  XR 150 mg, buspirone 15 mg bid and hydroxyzine 25 mg for middle insomnia since late 2018. She reports some benefit from Effexor but is concerned about gradual weight gain. Sleep has not improved on hydroxyzine as she wakes up after 4 hours and finds it difficult to fall asleep again. Prior to Effexor she tried Lexapro (medcine her sister is on) but it was not helpful.  Patient is complaining of chronic fatigue/myalgias and has an appointment with rheumatology soon.   Associated Signs/Symptoms: Depression Symptoms:  depressed mood, fatigue, difficulty concentrating, anxiety, disturbed sleep, weight gain, increased appetite, (Hypo) Manic Symptoms:  Irritable Mood, Anxiety Symptoms:  Excessive  Worry, Social Anxiety, Psychotic Symptoms:  None PTSD Symptoms: Negative  Past Psychiatric History: see above  Previous Psychotropic Medications: Yes   Substance Abuse History in the last 12 months:  No.  Consequences of Substance Abuse: NA  Past Medical History:  Past Medical History:  Diagnosis Date  . Anxiety   . Depression     Past Surgical History:  Procedure Laterality Date  . CHOLECYSTECTOMY N/A 01/11/2015   Procedure: LAPAROSCOPIC CHOLECYSTECTOMY WITH INTRAOPERATIVE CHOLANGIOGRAM;  Surgeon: Robert Bellow, MD;  Location: [facility] ORS;  Service: General;  Laterality: N/A;    Family Psychiatric History: Reviewed.  Family History:  Family History  Problem Relation Age of Onset  . Diabetes Mother   . Cholelithiasis Mother   . Depression Mother   . Cholelithiasis Paternal Grandfather   . Depression Sister   . Anxiety disorder Sister   . Anxiety disorder Maternal Aunt   . Depression Maternal Aunt     Social History:   Social History   Socioeconomic History  . Marital status: Single    Spouse name: Not on file  . Number of children: Not on file  . Years of education: Not on file  . Highest education level: Not on file  Occupational History  . Occupation: fulltrime student  Social Needs  . Financial resource strain: Not hard at all  . Food insecurity    Worry: Never true    Inability: Never true  . Transportation needs    Medical: No    Non-medical: No  Tobacco Use  . Smoking status: Never Smoker  . Smokeless tobacco: Never Used  Substance and Sexual Activity  . Alcohol use: No  Alcohol/week: 0.0 standard drinks  . Drug use: No  . Sexual activity: Never    Partners: Male    Birth control/protection: Pill  Lifestyle  . Physical activity    Days per week: 2 days    Minutes per session: 30 min  . Stress: Not at all  Relationships  . Social connections    Talks on phone: More than three times a week    Gets together: Never    Attends  religious service: More than 4 times per year    Active member of club or organization: No    Attends meetings of clubs or organizations: Never    Relationship status: Never married  Other Topics Concern  . 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  Allergies:  No Known Allergies  Metabolic Disorder Labs: No results found for: HGBA1C, MPG No results found for: PROLACTIN Lab Results  Component Value Date   CHOL 169 06/08/2019   TRIG 121 06/08/2019   HDL 54 06/08/2019   CHOLHDL 3.1 06/08/2019   LDLCALC 93 06/08/2019   Lab Results  Component Value Date   TSH 1.42 06/08/2019    Therapeutic Level Labs: No results found for: LITHIUM No results found for: CBMZ No results found for: VALPROATE  Current Medications: Current Outpatient Medications  Medication Sig Dispense Refill  . busPIRone (BUSPAR) 15 MG tablet Take 1 tablet (15 mg total) by mouth at bedtime. 30 tablet 0  . esomeprazole (NEXIUM) 20 MG capsule Take 20 mg by mouth daily at 12 noon.    . hydrOXYzine (ATARAX/VISTARIL) 25 MG tablet     . Norgestimate-Ethinyl Estradiol Triphasic (TRI-SPRINTEC) 0.18/0.215/0.25 MG-35 MCG tablet Take 1 tablet by mouth daily. 84 tablet 3   No current facility-administered medications for this visit.      Psychiatric Specialty Exam: Review of Systems  Constitutional: Positive for malaise/fatigue.  Gastrointestinal: Positive for nausea.  Musculoskeletal: Positive for myalgias.  Psychiatric/Behavioral: Positive for depression. Substance abuse: x: GAD; Social anxiety disorder; MDD recurrent mild. The patient is nervous/anxious and has insomnia.   All other systems reviewed and are negative.   There were no vitals taken for this visit.There is no height or weight on file to calculate BMI.  General Appearance: Casual and Well Groomed  Eye Contact:  Good   Speech:  Clear and Coherent and Normal Rate  Volume:  Normal  Mood:  Anxious and Depressed  Affect:  Full Range  Thought Process:  Goal Directed and Linear  Orientation:  Full (Time, Place, and Person)  Thought Content:  Logical and Rumination  Suicidal Thoughts:  No  Homicidal Thoughts:  No  Memory:  Immediate;   Good Recent;   Good Remote;   Good  Judgement:  Good  Insight:  Good  Psychomotor Activity:  Normal  Concentration:  Concentration: Good and Attention Span: Good  Recall:  Good  Fund of Knowledge:Good  Language: Good  Akathisia:  Negative  Handed:  Right  AIMS (if indicated):  not done  Assets:  Communication Skills Desire for Improvement Financial Resources/Insurance Housing Social Support Talents/Skills  ADL's:  Intact  Cognition: WNL  Sleep:  Fair   Screenings: GAD-7     [facility]  Total GAD-7 Score  8    PHQ2-9     [facility]  PHQ-2 Total Score  2  PHQ-9 Total Score  7      Assessment and Plan: 18 yo SWF who has been treated by Dr. Maryruth Bun since October 20187 for major depression and anxiety disorder. Vedha reports hx of depressed mood (improved some), generalized anxiety (excessive worrying, ruminations, racing thoughts) as well as panic attacks when she had to be in public. She has no hx of mania, psychosis, suicidal ideation or psychiatric admissions., She does not abuse alcohol or street drugs. 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 She reports some benefit from Effexor but is concerned about gradual weight gain. Sleep has not improved on hydroxyzine as she wakes up after 4 hours and finds it difficult to fall asleep again.   Dx: GAD; Social anxiety disorder; MDD recurrent mild  Plan: Continue buspirone 15 mg bid, increase hydroxyzine to 50-100 mg prn insomnia;  taper Effexor off and instead start Cymbalta 30 mg x 1 week then increase to 60 mg daily. If hydroxyzine does not help with middle insomnia we will try Ambien CR next. I will refrain from a trial of trazodone, amitriptyline or doxepin out of concerned about increased risk of serotonergic syndrome with 3 pro-serotoneric medications. We will write a letter supporting patient in her desire to have an emotional support anmial (dog). Next appointment in 6 weeks. The plan was discussed with patient who had an opportunity to ask questions and these were all answered. I spend 60 minutes in direct face to face clinical contact with the patient and devoted approximately 50% of this time to explanation of diagnosis, discussion of treatment options and med education.  Magdalene Patricia, MD 11/23/20209:45 AM

## 2019-07-14 ENCOUNTER — Telehealth (HOSPITAL_COMMUNITY): Payer: Self-pay

## 2019-07-14 ENCOUNTER — Encounter (HOSPITAL_COMMUNITY): Payer: Self-pay

## 2019-07-14 NOTE — Telephone Encounter (Signed)
Medication management - telephone call with pt to follow up on need for letter to support her having a service animal.  Letter completed to support her request and mailed to patient's requested address.  Pt to call back if anything further needed.

## 2019-07-15 ENCOUNTER — Telehealth (INDEPENDENT_AMBULATORY_CARE_PROVIDER_SITE_OTHER): Payer: Managed Care, Other (non HMO) | Admitting: Rheumatology

## 2019-07-15 ENCOUNTER — Encounter: Payer: Self-pay | Admitting: Rheumatology

## 2019-07-15 ENCOUNTER — Other Ambulatory Visit: Payer: Self-pay

## 2019-07-15 DIAGNOSIS — M791 Myalgia, unspecified site: Secondary | ICD-10-CM

## 2019-07-15 DIAGNOSIS — Z8719 Personal history of other diseases of the digestive system: Secondary | ICD-10-CM

## 2019-07-15 DIAGNOSIS — R768 Other specified abnormal immunological findings in serum: Secondary | ICD-10-CM | POA: Diagnosis not present

## 2019-07-15 DIAGNOSIS — G4709 Other insomnia: Secondary | ICD-10-CM | POA: Diagnosis not present

## 2019-07-15 DIAGNOSIS — F419 Anxiety disorder, unspecified: Secondary | ICD-10-CM

## 2019-07-15 DIAGNOSIS — F329 Major depressive disorder, single episode, unspecified: Secondary | ICD-10-CM

## 2019-07-15 DIAGNOSIS — R5383 Other fatigue: Secondary | ICD-10-CM | POA: Diagnosis not present

## 2019-07-17 ENCOUNTER — Encounter: Payer: Self-pay | Admitting: Family Medicine

## 2019-07-22 ENCOUNTER — Encounter: Payer: Self-pay | Admitting: Rheumatology

## 2019-07-22 ENCOUNTER — Other Ambulatory Visit: Payer: Self-pay

## 2019-07-22 DIAGNOSIS — M791 Myalgia, unspecified site: Secondary | ICD-10-CM

## 2019-07-22 DIAGNOSIS — R5383 Other fatigue: Secondary | ICD-10-CM

## 2019-07-22 DIAGNOSIS — R768 Other specified abnormal immunological findings in serum: Secondary | ICD-10-CM

## 2019-07-29 LAB — CK: Total CK: 62 U/L (ref 29–143)

## 2019-07-29 LAB — URINALYSIS, ROUTINE W REFLEX MICROSCOPIC
Bacteria, UA: NONE SEEN /HPF
Bilirubin Urine: NEGATIVE
Glucose, UA: NEGATIVE
Hyaline Cast: NONE SEEN /LPF
Ketones, ur: NEGATIVE
Leukocytes,Ua: NEGATIVE
Nitrite: NEGATIVE
Protein, ur: NEGATIVE
RBC / HPF: NONE SEEN /HPF (ref 0–2)
Specific Gravity, Urine: 1.019 (ref 1.001–1.03)
WBC, UA: NONE SEEN /HPF (ref 0–5)
pH: 6.5 (ref 5.0–8.0)

## 2019-07-29 LAB — PROTEIN ELECTROPHORESIS, SERUM, WITH REFLEX
Albumin ELP: 3.6 g/dL — ABNORMAL LOW (ref 3.8–4.8)
Alpha 1: 0.4 g/dL — ABNORMAL HIGH (ref 0.2–0.3)
Alpha 2: 0.8 g/dL (ref 0.5–0.9)
Beta 2: 0.3 g/dL (ref 0.2–0.5)
Beta Globulin: 0.6 g/dL (ref 0.4–0.6)
Gamma Globulin: 1.3 g/dL (ref 0.8–1.7)
Total Protein: 6.9 g/dL (ref 6.1–8.1)

## 2019-07-29 LAB — RNP ANTIBODY: Ribonucleic Protein(ENA) Antibody, IgG: <1 AI

## 2019-07-29 LAB — ANTI-SCLERODERMA ANTIBODY: Scleroderma (Scl-70) (ENA) Antibody, IgG: <1 AI

## 2019-07-29 LAB — ANTI-SMITH ANTIBODY: ENA SM Ab Ser-aCnc: <1 AI

## 2019-07-29 LAB — C3 AND C4
C3 Complement: 168 mg/dL (ref 83–193)
C4 Complement: 19 mg/dL (ref 15–57)

## 2019-07-29 LAB — SJOGRENS SYNDROME-B EXTRACTABLE NUCLEAR ANTIBODY: SSB (La) (ENA) Antibody, IgG: <1 AI

## 2019-07-29 LAB — SJOGRENS SYNDROME-A EXTRACTABLE NUCLEAR ANTIBODY: SSA (Ro) (ENA) Antibody, IgG: <1 AI

## 2019-07-29 LAB — ANTI-DNA ANTIBODY, DOUBLE-STRANDED: ds DNA Ab: 1 IU/mL

## 2019-07-29 LAB — HLA-B27 ANTIGEN: HLA-B27 Antigen: POSITIVE — AB

## 2019-07-29 NOTE — Progress Notes (Signed)
SPEP consistent with acute inflammatory pattern.  HLA-B27 positive.  We will discuss lab work at new patient follow up visit.

## 2019-08-03 ENCOUNTER — Encounter: Payer: Self-pay | Admitting: Family Medicine

## 2019-08-06 ENCOUNTER — Ambulatory Visit: Payer: Managed Care, Other (non HMO) | Admitting: Family Medicine

## 2019-08-25 ENCOUNTER — Encounter: Payer: Self-pay | Admitting: Rheumatology

## 2019-08-25 NOTE — Progress Notes (Deleted)
Office Visit Note  Patient: Sharon Kelly             Date of Birth: June 10, 2001           MRN: [id number]             PCP: Doren Custard, FNP Referring: Doren Custard, FNP Visit Date: 08/27/2019 Occupation: @GUAROCC @  Subjective:  No chief complaint on file.   History of Present Illness: Sharon Kelly is a 19 y.o. female ***   Activities of Daily Living:  Patient reports morning stiffness for *** {minute/hour:19697}.   Patient {ACTIONS;DENIES/REPORTS:21021675::"Denies"} nocturnal pain.  Difficulty dressing/grooming: {ACTIONS;DENIES/REPORTS:21021675::"Denies"} Difficulty climbing stairs: {ACTIONS;DENIES/REPORTS:21021675::"Denies"} Difficulty getting out of chair: {ACTIONS;DENIES/REPORTS:21021675::"Denies"} Difficulty using hands for taps, buttons, cutlery, and/or writing: {ACTIONS;DENIES/REPORTS:21021675::"Denies"}  No Rheumatology ROS completed.   PMFS History:  Patient Active Problem List   Diagnosis Date Noted  . GAD (generalized anxiety disorder) 07/13/2019  . Social anxiety disorder 07/13/2019  . Major depressive disorder, recurrent episode, mild (HCC) 07/13/2019  . Eructation 07/13/2015  . Nausea 03/02/2015    Past Medical History:  Diagnosis Date  . Anxiety   . Depression     Family History  Problem Relation Age of Onset  . Diabetes Mother   . Cholelithiasis Mother   . Depression Mother   . Cholelithiasis Paternal Grandfather   . Depression Sister   . Anxiety disorder Sister   . Anxiety disorder Maternal Aunt   . Depression Maternal Aunt    Past Surgical History:  Procedure Laterality Date  . 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 Influenza,inj,Quad PF,6+ Mos 06/10/2018, 06/22/2019  . Tdap 06/22/2019     Objective: Vital Signs: There were no vitals taken for this visit.   Physical Exam   Musculoskeletal Exam: ***  CDAI Exam: CDAI Score: -- Patient Global: --; Provider Global: -- Swollen: --; Tender: -- Joint Exam 08/27/2019   No joint exam has been documented for this visit   There is currently no information documented on the homunculus. Go to the Rheumatology activity and complete the homunculus joint exam.  Investigation: No additional findings.  Imaging: No results found.  Recent Labs: Lab Results  Component Value Date   WBC 6.4 06/08/2019   HGB 10.7 (L) 06/08/2019   PLT 292 06/08/2019   NA 138 06/08/2019   K 4.3 06/08/2019   CL 105 06/08/2019   CO2 27 06/08/2019   GLUCOSE 97 06/08/2019   BUN 6 (L) 06/08/2019   CREATININE 0.67 06/08/2019   BILITOT 0.2 06/08/2019   ALKPHOS 67 03/02/2015   AST 16 06/08/2019   ALT 18 06/08/2019   PROT 6.9 07/27/2019   ALBUMIN 4.9 03/02/2015   CALCIUM 9.0 06/08/2019   GFRAA 147 06/08/2019    Speciality Comments: No specialty comments available.  Procedures:  No procedures performed Allergies: Patient has no known allergies.   Assessment / Plan:     Visit Diagnoses: No diagnosis found.  Orders: No orders of the defined types were placed in this encounter.  No orders of the defined types were placed in this encounter.   Face-to-face time spent with patient was *** minutes. Greater than 50% of time was spent  in counseling and coordination of care.  Follow-Up Instructions: No follow-ups on file.   Ofilia Neas, PA-C  Note - This record has been created using Dragon software.  Chart creation errors have been sought, but may not always  have been located. Such creation errors do not reflect on  the standard of medical care.

## 2019-08-27 ENCOUNTER — Ambulatory Visit: Payer: 59 | Admitting: Rheumatology

## 2019-08-28 ENCOUNTER — Ambulatory Visit (HOSPITAL_COMMUNITY): Payer: 59 | Admitting: Psychiatry

## 2019-08-31 ENCOUNTER — Ambulatory Visit (INDEPENDENT_AMBULATORY_CARE_PROVIDER_SITE_OTHER): Payer: 59 | Admitting: Psychiatry

## 2019-08-31 ENCOUNTER — Other Ambulatory Visit: Payer: Self-pay

## 2019-08-31 DIAGNOSIS — Z760 Encounter for issue of repeat prescription: Secondary | ICD-10-CM | POA: Diagnosis not present

## 2019-08-31 DIAGNOSIS — F4010 Social phobia, unspecified: Secondary | ICD-10-CM | POA: Diagnosis not present

## 2019-08-31 DIAGNOSIS — F411 Generalized anxiety disorder: Secondary | ICD-10-CM | POA: Diagnosis not present

## 2019-08-31 DIAGNOSIS — F330 Major depressive disorder, recurrent, mild: Secondary | ICD-10-CM | POA: Diagnosis not present

## 2019-08-31 MED ORDER — DULOXETINE HCL 60 MG PO CPEP: 60.0000 mg | ORAL_CAPSULE | Freq: Every day | ORAL | 1 refills | Status: DC

## 2019-08-31 MED ORDER — BUSPIRONE HCL 15 MG PO TABS: 15.0000 mg | ORAL_TABLET | Freq: Two times a day (BID) | ORAL | 5 refills | Status: DC

## 2019-08-31 MED ORDER — HYDROXYZINE HCL 50 MG PO TABS: 50.0000 mg | ORAL_TABLET | Freq: Every evening | ORAL | 5 refills | Status: DC | PRN

## 2019-08-31 NOTE — Progress Notes (Signed)
BH MD/PA/NP OP Progress Note  08/31/2019 3:16 PM Jilliam A Godeaux  MRN:  [id number] Interview was conducted using WebEx teleconferencing application and I verified that I was speaking with the correct person using two identifiers. I discussed the limitations of evaluation and management by telemedicine and  the availability of in person appointments. Patient expressed understanding and agreed to proceed.  Chief Complaint: More depressed lately.   HPI: 19 yo SWF who has been treated by Dr. Maryruth Bun since October 20187 for major depression and anxiety disorder. Avalie reports hx of depressed mood (improved some), generalized anxiety (excessive worrying, ruminations, racing thoughts) as well as panic attacks when she had to be in public. She has no hx of mania, psychosis, suicidal ideation or psychiatric admissions., She does not abuse alcohol or street drugs. 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 She reports some benefit from Effexor but is concerned about gradual weight gain. Sleep has improved on hydroxyzine and she has only been taking one 50 mg dose (I recommended taking two caps). We have changed Effexor to Cymbalta 60 mg and she reports further improvement in mood and no change in appetite/weight. She lost her aunt recently (cardiac arrest) and her mood declined "a little".  Visit Diagnosis:    ICD-10-CM   1. GAD (generalized anxiety disorder)  F41.1   2. Prescription refill  Z76.0 busPIRone (BUSPAR) 15 MG tablet  3. Major depressive disorder, recurrent episode, mild (HCC)  F33.0   4. Social anxiety disorder  F40.10     Past Psychiatric History: Please see intake H&P.  Past Medical History:  Past Medical History:  Diagnosis Date  . Anxiety   . Depression     Past Surgical History:  Procedure Laterality Date  . CHOLECYSTECTOMY N/A 01/11/2015   Procedure: LAPAROSCOPIC CHOLECYSTECTOMY WITH INTRAOPERATIVE  CHOLANGIOGRAM;  Surgeon: Earline Mayotte, MD;  Location: [facility] ORS;  Service: General;  Laterality: N/A;    Family Psychiatric History: Reviewed.  Family History:  Family History  Problem Relation Age of Onset  . Diabetes Mother   . Cholelithiasis Mother   . Depression Mother   . Cholelithiasis Paternal Grandfather   . Depression Sister   . Anxiety disorder Sister   . Anxiety disorder Maternal Aunt   . Depression Maternal Aunt     Social History:  Social History   Socioeconomic History  . Marital status: Single    Spouse name: Not on file  . Number of children: Not on file  . Years of education: Not on file  . Highest education level: Not on file  Occupational History  . Occupation: fulltrime student  Tobacco Use  . Smoking status: Never Smoker  . Smokeless tobacco: Never Used  Substance and Sexual Activity  . Alcohol use: No    Alcohol/week: 0.0 standard drinks  . Drug use: No  . Sexual activity: Never    Partners: Male    Birth control/protection: Pill  Other Topics Concern  . 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 Difficulty of Paying Living Expenses: Not hard at all  Food Insecurity: No Food Insecurity  . Worried About Programme researcher, broadcasting/film/video in the Last Year: Never true  . Ran Out of Food in the Last Year:  Never true  Transportation Needs: No Transportation Needs  . Lack of Transportation (Medical): No  . Lack of Transportation (Non-Medical): No  Physical Activity: Insufficiently Active  . Days of Exercise per Week: 2 days  . Minutes of Exercise per Session: 30 min  Stress: No Stress Concern Present  . Feeling of Stress : Not at all  Social Connections: Somewhat Isolated  . Frequency of Communication with Friends and Family: More than three times a week  . Frequency of Social Gatherings with Friends and  Family: Never  . Attends Religious Services: More than 4 times per year  . Active Member of Clubs or Organizations: No  . Attends Archivist Meetings: Never  . Marital Status: Never married    Allergies: No Known Allergies  Metabolic Disorder Labs: No results found for: HGBA1C, MPG No results found for: PROLACTIN Lab Results  Component Value Date   CHOL 169 06/08/2019   TRIG 121 06/08/2019   HDL 54 06/08/2019   CHOLHDL 3.1 06/08/2019   Babb 93 06/08/2019   Lab Results  Component Value Date   TSH 1.42 06/08/2019    Therapeutic Level Labs: No results found for: LITHIUM No results found for: VALPROATE No components found for:  CBMZ  Current Medications: Current Outpatient Medications  Medication Sig Dispense Refill  . busPIRone (BUSPAR) 15 MG tablet Take 1 tablet (15 mg total) by mouth 2 (two) times daily. 60 tablet 5  . DULoxetine (CYMBALTA) 60 MG capsule Take 1 capsule (60 mg total) by mouth daily. 90 capsule 1  . esomeprazole (NEXIUM) 20 MG capsule Take 20 mg by mouth daily at 12 noon.    . hydrOXYzine (ATARAX/VISTARIL) 50 MG tablet Take 1 tablet (50 mg total) by mouth at bedtime as needed and may repeat dose one time if needed (insomnia). 60 tablet 5  . Norgestimate-Ethinyl Estradiol Triphasic (TRI-SPRINTEC) 0.18/0.215/0.25 MG-35 MCG tablet Take 1 tablet by mouth daily. 84 tablet 3   No current facility-administered medications for this visit.    Psychiatric Specialty Exam: Review of Systems  Psychiatric/Behavioral: The patient is nervous/anxious.   All other systems reviewed and are negative.   There were no vitals taken for this visit.There is no height or weight on file to calculate BMI.  General Appearance: Casual and Well Groomed  Eye Contact:  Good  Speech:  Clear and Coherent and Normal Rate  Volume:  Normal  Mood:  Depressed  Affect:  Full Range  Thought Process:  Goal Directed and Linear  Orientation:  Full (Time, Place, and Person)   Thought Content: Logical   Suicidal Thoughts:  No  Homicidal Thoughts:  No  Memory:  Immediate;   Good Recent;   Good Remote;   Good  Judgement:  Good  Insight:  Good  Psychomotor Activity:  Normal  Concentration:  Concentration: Good  Recall:  Good  Fund of Knowledge: Good  Language: Good  Akathisia:  Negative  Handed:  Right  AIMS (if indicated): not done  Assets:  Communication Skills Desire for Improvement Financial Resources/Insurance [place] Talents/Skills  ADL's:  Intact  Cognition: WNL  Sleep:  Fair   Screenings: GAD-7     Office Visit from 06/08/2019 in [facility]  Total GAD-7 Score  8    PHQ2-9     Office Visit from 06/08/2019 in [facility]  PHQ-2 Total Score  2  PHQ-9 Total Score  7       Assessment and Plan:  19 yo SWF who has been treated by Dr. Maryruth Bun since October 20187 for major depression and anxiety disorder. [place] reports hx of depressed mood (improved some), generalized anxiety (excessive worrying, ruminations, racing thoughts) as well as panic attacks when she had to be in public. She has no hx of mania, psychosis, suicidal ideation or psychiatric admissions., She does not abuse alcohol or street drugs. 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 She reports some benefit from Effexor but is concerned about gradual weight gain. Sleep has improved on hydroxyzine and she has only been taking one 50 mg dose (I recommended taking two caps). We have changed Effexor to Cymbalta 60 mg and she reports further improvement in mood and no change in appetite/weight. She lost her aunt recently (cardiac arrest) and her mood declined "a little".   Dx: GAD; Social anxiety disorder; MDD recurrent mild  Plan: Continue buspirone 15 mg bid, hydroxyzine to 50 mg prn insomnia, duloxetine 60 mg daily. Next appointment in 4  months or prn. The plan was discussed with patient who had an opportunity to ask questions and these were all answered. I spend 25 minutes in videoconferencing with the patient.    Magdalene Patricia, MD 08/31/2019, 3:16 PM

## 2019-09-01 NOTE — Progress Notes (Signed)
Office Visit Note  Patient: Sharon Kelly             Date of Birth: [birth year]           MRN: [id number]             PCP: Hubbard Hartshorn, FNP Referring: Hubbard Hartshorn, FNP Visit Date: 09/03/2019 Occupation: _0 @  Subjective:  Chronic SI joint pain bilaterally   History of Present Illness: Sharon Kelly is a 19 y.o. female with history of polyarthralgia, chronic SI joint pain, and positive HLA-B27.  She continues to have chronic SI joint pain bilaterally.  She states that the pain is more severe on the right side.  She denies any symptoms of radiculopathy.  She denies any midline spinal tenderness.  She states she typically has morning stiffness in her lower back lasting about 1 hour.  She states that her back pain does not improve with activities and is typically worse in the evenings.  She has been experiencing nocturnal pain.  She is experiencing trapezius muscle tension and muscle tenderness bilaterally.  She denies any other joint pain or joint swelling at this time.  She denies any Achilles tendinitis or plantar fasciitis.  She denies any eye inflammation.  She experiences symptoms of Raynaud's in her feet.     Activities of Daily Living:  Patient reports morning stiffness for 1 hour.   Patient Reports nocturnal pain.  Difficulty dressing/grooming: Denies Difficulty climbing stairs: Denies Difficulty getting out of chair: Denies Difficulty using hands for taps, buttons, cutlery, and/or writing: Denies  Review of Systems  Constitutional: Positive for fatigue.  HENT: Positive for mouth dryness. Negative for mouth sores and nose dryness.   Eyes: Negative for pain, itching, visual disturbance and dryness.  Respiratory: Negative for cough, hemoptysis, shortness of breath, wheezing and difficulty breathing.   Cardiovascular: Negative for chest pain, palpitations, hypertension and swelling in legs/feet.  Gastrointestinal: Positive for constipation and diarrhea. Negative for  blood in stool.  Endocrine: Negative for increased urination.  Genitourinary: Negative for difficulty urinating and painful urination.  Musculoskeletal: Positive for arthralgias, joint pain and morning stiffness. Negative for joint swelling, myalgias, muscle weakness, muscle tenderness and myalgias.  Skin: Positive for color change. Negative for pallor, rash, hair loss, nodules/bumps, skin tightness, ulcers and sensitivity to sunlight.  Allergic/Immunologic: Negative for susceptible to infections.  Neurological: Positive for headaches. Negative for dizziness, numbness, memory loss and weakness.  Hematological: Negative for swollen glands.  Psychiatric/Behavioral: Positive for sleep disturbance. Negative for depressed mood and confusion. The patient is not nervous/anxious.     PMFS History:  Patient Active Problem List   Diagnosis Date Noted  . GAD (generalized anxiety disorder) 07/13/2019  . Social anxiety disorder 07/13/2019  . Major depressive disorder, recurrent episode, mild ([place]) 07/13/2019  . Eructation 07/13/2015  . Nausea 03/02/2015    Past Medical History:  Diagnosis Date  . Anxiety   . Depression     Family History  Problem Relation Age of Onset  . Diabetes Mother   . Cholelithiasis Mother   . Depression Mother   . Cholelithiasis Paternal Grandfather   . Depression Sister   . Anxiety disorder Sister   . Anxiety disorder Maternal Aunt   . Depression Maternal Aunt    Past Surgical History:  Procedure Laterality Date  . 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 Influenza,inj,Quad PF,6+ Mos 06/10/2018, 06/22/2019  . Tdap  06/22/2019     Objective: Vital Signs: BP (!) 137/92 (BP Location: Left Arm, Patient Position: Sitting, Cuff Size: Normal)   Pulse (!) 123   Resp 13   Ht 5' (1.524 m)   Wt 164 lb 12.8 oz (74.8 kg)   BMI 32.19 kg/m    Physical Exam Vitals and nursing note reviewed.  Constitutional:      Appearance: She is well-developed.  HENT:     Head: Normocephalic and atraumatic.  Eyes:     Conjunctiva/sclera: Conjunctivae normal.     Comments: No conjunctival injection noted  Cardiovascular:     Rate and Rhythm: Normal rate and regular rhythm.     Heart sounds: Normal heart sounds.  Pulmonary:     Effort: Pulmonary effort is normal.     Breath sounds: Normal breath sounds.  Abdominal:     General: Bowel sounds are normal.     Palpations: Abdomen is soft.  Musculoskeletal:     Cervical back: Normal range of motion.  Lymphadenopathy:     Cervical: No cervical adenopathy.  Skin:    General: Skin is warm and dry.     Capillary Refill: Capillary refill takes less than 2 seconds.     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  Psychiatric:        Behavior: Behavior normal.      Musculoskeletal Exam: C-spine, thoracic spine, and lumbar spine good ROM.  Tenderness over bilateral SI joints.  No midline spinal tenderness. Shoulder joints, elbow joints, wrist joints, MCPs, PIPs, and DIPs good ROM with no synovitis.  Complete fist formation bilaterally.  Hip joints good ROM with no discomfort.  Knee joints good ROM with no discomfort.  No warmth or effusion of knee joints.  Ankle joints good ROM with no discomfort.  No tenderness or inflammation of ankle joints. Mild tenderness over the plantar aspect of the right foot.   CDAI Exam: CDAI Score: - Patient Global: -; Provider Global: - Swollen: -; Tender: - Joint Exam 09/03/2019   No joint exam has been documented for this visit    There is currently no information documented on the homunculus. Go to the Rheumatology activity and complete the homunculus joint exam.  Investigation: No additional findings.  Imaging: XR Thoracic Spine 2 View  Result Date: 09/03/2019 Dextroscoliosis of the thoracic spine was noted.  No significant disc space narrowing or syndesmophytes were noted.  No facet joint arthropathy was noted. Impression: Dextroscoliosis of the thoracic spine was noted.  XR Lumbar Spine 2-3 Views  Result Date: 09/03/2019 Levoscoliosis of the lumbar spine was noted.  No significant disc space narrowing or facet joint arthropathy was noted.  No syndesmophytes were noted. Impression: Levoscoliosis of the lumbar spine.  XR Pelvis 1-2 Views  Result Date: 09/03/2019 No SI joint sclerosis or narrowing was noted.  No erosive changes were noted. Impression: Unremarkable x-ray of the SI joints.   Recent Labs: Lab Results  Component Value Date   WBC 6.4 06/08/2019   HGB 10.7 (L) 06/08/2019   PLT 292 06/08/2019   NA 138 06/08/2019   K 4.3 06/08/2019   CL 105 06/08/2019  CO2 27 06/08/2019   GLUCOSE 97 06/08/2019   BUN 6 (L) 06/08/2019   CREATININE 0.67 06/08/2019   BILITOT 0.2 06/08/2019   ALKPHOS 67 03/02/2015   AST 16 06/08/2019   ALT 18 06/08/2019   PROT 6.9 07/27/2019   ALBUMIN 4.9 03/02/2015   CALCIUM 9.0 06/08/2019   GFRAA 147 06/08/2019   July 27, 2019 SPEP consistent with acute inflammatory pattern, CK 62, ENA negative, C3-C4 normal, HLA-B27 positive, UA showed trace hemoglobin and epithelial cells (Patient was on her menstrual cycle)   Speciality Comments: No specialty comments available.  Procedures:  No procedures performed   Allergies: Patient has no known allergies.   Assessment / Plan:     Visit Diagnoses: Polyarthralgia: July 27, 2019 SPEP consistent with acute inflammatory pattern, CK 62, ENA negative, C3-C4 normal, HLA-B27 positive: Lab work from 07/27/2019 was reviewed with  the patient today.  All questions were addressed.  We discussed the the clinical associations with having a positive HLA-B27. She has no synovitis or dactylitis on exam today.  She has chronic SI joint pain bilaterally, worse on the right than left.  She has tenderness over the right SI joint on exam today.  She experiences intermittent thoracic spinal pain.  X-rays of the thoracic, lumbar spine, and pelvis were obtained today.  She has mild tenderness on the plantar aspect of the right foot.  No Achilles tendinitis noted.  No conjunctival injection noted. No history or family history of psoriasis.  She has not had any eye pain, eye dryness, photophobia, floaters.  She has chronic constipation alternating with diarrhea.  She has not had any blood in her stool recently.  She was advised to follow-up with her GI specialist in [place] to  discuss scheduling a colonoscopy in the future.  X-rays of the SI joint were unremarkable. She has levoscoliosis of the lumbar spine.  We discussed a referral to the spine and scoliosis clinic as well as proceeding with a MRI of the SI joints to assess for inflammation but she would like to discuss with her family prior to moving forward.  She will call us back with her decisions.  She will follow up in 6 weeks.   HLA B27 positive -X-rays of the thoracic, lumbar spine, and pelvis were obtained today.  Plan: XR Lumbar Spine 2-3 Views, XR Pelvis 1-2 Views, XR Thoracic Spine 2 View.  The x-rays were consistent with as shaped with a scoliosis but no disc space narrowing or syndesmophytes were noted.  Pain in thoracic spine: She experiences intermittent thoracic pain.  She has no midline spinal tenderness on exam today.  X-rays of the thoracic spine were obtained.  X-rays were consistent with scoliosis.  Chronic SI joint pain -HLA-B27 positive: She has chronic pain in bilateral SI joints.  She has tenderness of the right SI joint on exam.  X-rays of the pelvis were obtained  today.  X-rays were unremarkable.  We discussed the next step will be to order a MRI to assess for active inflammation.  She is going to discuss with her family before proceeding with a MRI.  Plan: XR Pelvis 1-2 Views.  The x-rays were unremarkable.  Chronic midline low back pain without sciatica -She has chronic lower back pain.  She has no midline spinal tenderness at this time.  She has no symptoms of radiculopathy.  X-rays of the lumbar spine were obtained today. Levoscoliosis of the lumbar spine.  We discussed a referral to the spine and scoliosis clinic,  but she would like to discuss with her family and will call us back. Plan: XR Lumbar Spine 2-3 Views  Plantar fasciitis, right: She has mild tenderness over the right plantar fascia.  Other fatigue: She has chronic fatigue secondary to insomnia.  Positive ANA (antinuclear antibody) - ANA 1: 40 speckled, ENA negative: Lab work from 07/27/2019 was reviewed with the patient today.  All questions were addressed.  She experiences intermittent symptoms of Raynaud's in her feet.  Livedo reticularis was noted on bilateral lower extremities.  She experiences mouth dryness but no eye dryness.  She has not had any recurrent or oral or nasal ulcerations.  She has not noticed any enlarged lymph nodes or recent fevers.  She has not had any facial rashes.  No malar rash was noted on exam today.  Raynaud's syndrome without gangrene: She experiences intermittent symptoms of Raynaud's in both feet.  No ulcerations or signs of gangrene were noted.  Livedo reticularis: Bilateral lower extremities.  History of diarrhea - Alternating with constipation.  She denies any blood in her stool or abdominal pain at this time.  She was advised to follow-up with her GI specialist in [place] to discuss possibly scheduling a colonoscopy.  History of gastroesophageal reflux (GERD)  Myalgia - History of myalgia here for many years.  Other insomnia: She has very interrupted  sleep at night.   Anxiety and depression    Orders: Orders Placed This Encounter  Procedures  . XR Lumbar Spine 2-3 Views  . XR Pelvis 1-2 Views  . XR Thoracic Spine 2 View   No orders of the defined types were placed in this encounter.   Face-to-face time spent with patient was 40 minutes. Greater than 50% of time was spent in counseling and coordination of care.  Follow-Up Instructions: Return in about 6 weeks (around 10/15/2019) for HLA-B27+.  Hazel Sams, PA-C   I examined and evaluated the patient with Hazel Sams PA.  Patient had tenderness over her thoracic, lumbar spine and paraspinal region.  She also had tenderness over the SI joints more prominent on the right side.  No synovitis was noted on the examination.  The x-rays were consistent with thoracolumbar scoliosis.  No SI joint sclerosis was noted.  She is HLA-B27 positive we discussed possibility of obtaining MRI to look further.  She will let us know if she wants to proceed with that.  We also offered referral to scoliosis center which she will discuss with her mother and will let us know.  The plan of care was discussed as noted above.  Bo Merino, MD  Note - This record has been created using Editor, commissioning.  Chart creation errors have been sought, but may not always  have been located. Such creation errors do not reflect on  the standard of medical care.

## 2019-09-03 ENCOUNTER — Other Ambulatory Visit: Payer: Self-pay

## 2019-09-03 ENCOUNTER — Ambulatory Visit: Payer: Self-pay

## 2019-09-03 ENCOUNTER — Encounter: Payer: Self-pay | Admitting: Rheumatology

## 2019-09-03 ENCOUNTER — Ambulatory Visit (INDEPENDENT_AMBULATORY_CARE_PROVIDER_SITE_OTHER): Payer: Managed Care, Other (non HMO) | Admitting: Rheumatology

## 2019-09-03 VITALS — BP 137/92 | HR 123 | Resp 13 | Ht 60.0 in | Wt 164.8 lb

## 2019-09-03 DIAGNOSIS — M2550 Pain in unspecified joint: Secondary | ICD-10-CM

## 2019-09-03 DIAGNOSIS — M722 Plantar fascial fibromatosis: Secondary | ICD-10-CM

## 2019-09-03 DIAGNOSIS — R7689 Other specified abnormal immunological findings in serum: Secondary | ICD-10-CM

## 2019-09-03 DIAGNOSIS — M545 Low back pain: Secondary | ICD-10-CM

## 2019-09-03 DIAGNOSIS — Z87898 Personal history of other specified conditions: Secondary | ICD-10-CM

## 2019-09-03 DIAGNOSIS — Z8719 Personal history of other diseases of the digestive system: Secondary | ICD-10-CM

## 2019-09-03 DIAGNOSIS — G8929 Other chronic pain: Secondary | ICD-10-CM

## 2019-09-03 DIAGNOSIS — M533 Sacrococcygeal disorders, not elsewhere classified: Secondary | ICD-10-CM | POA: Diagnosis not present

## 2019-09-03 DIAGNOSIS — M546 Pain in thoracic spine: Secondary | ICD-10-CM | POA: Diagnosis not present

## 2019-09-03 DIAGNOSIS — I7300 Raynaud's syndrome without gangrene: Secondary | ICD-10-CM

## 2019-09-03 DIAGNOSIS — R768 Other specified abnormal immunological findings in serum: Secondary | ICD-10-CM

## 2019-09-03 DIAGNOSIS — Z1589 Genetic susceptibility to other disease: Secondary | ICD-10-CM

## 2019-09-03 DIAGNOSIS — F419 Anxiety disorder, unspecified: Secondary | ICD-10-CM

## 2019-09-03 DIAGNOSIS — R231 Pallor: Secondary | ICD-10-CM

## 2019-09-03 DIAGNOSIS — F329 Major depressive disorder, single episode, unspecified: Secondary | ICD-10-CM

## 2019-09-03 DIAGNOSIS — R5383 Other fatigue: Secondary | ICD-10-CM

## 2019-09-03 DIAGNOSIS — G4709 Other insomnia: Secondary | ICD-10-CM

## 2019-09-03 DIAGNOSIS — M791 Myalgia, unspecified site: Secondary | ICD-10-CM

## 2019-09-17 ENCOUNTER — Other Ambulatory Visit (HOSPITAL_COMMUNITY): Payer: Self-pay | Admitting: Psychiatry

## 2019-09-23 ENCOUNTER — Encounter: Payer: Self-pay | Admitting: Family Medicine

## 2019-09-29 ENCOUNTER — Other Ambulatory Visit: Payer: Self-pay | Admitting: Emergency Medicine

## 2019-09-29 DIAGNOSIS — R110 Nausea: Secondary | ICD-10-CM

## 2019-10-18 NOTE — Progress Notes (Deleted)
Office Visit Note  Patient: Sharon Kelly             Date of Birth: 26-Apr-2001           MRN: [id number]             PCP: Hubbard Hartshorn, FNP Referring: Hubbard Hartshorn, FNP Visit Date: 10/22/2019 Occupation: '@GUAROCC'$ @  Subjective:  No chief complaint on file.   History of Present Illness: Sharon Kelly is a 19 y.o. female ***   Activities of Daily Living:  Patient reports morning stiffness for *** {minute/hour:19697}.   Patient {ACTIONS;DENIES/REPORTS:21021675::"Denies"} nocturnal pain.  Difficulty dressing/grooming: {ACTIONS;DENIES/REPORTS:21021675::"Denies"} Difficulty climbing stairs: {ACTIONS;DENIES/REPORTS:21021675::"Denies"} Difficulty getting out of chair: {ACTIONS;DENIES/REPORTS:21021675::"Denies"} Difficulty using hands for taps, buttons, cutlery, and/or writing: {ACTIONS;DENIES/REPORTS:21021675::"Denies"}  No Rheumatology ROS completed.   PMFS History:  Patient Active Problem List   Diagnosis Date Noted  . GAD (generalized anxiety disorder) 07/13/2019  . Social anxiety disorder 07/13/2019  . Major depressive disorder, recurrent episode, mild ([place]) 07/13/2019  . Eructation 07/13/2015  . Nausea 03/02/2015    Past Medical History:  Diagnosis Date  . Anxiety   . Depression     Family History  Problem Relation Age of Onset  . Diabetes Mother   . Cholelithiasis Mother   . Depression Mother   . Cholelithiasis Paternal Grandfather   . Depression Sister   . Anxiety disorder Sister   . Anxiety disorder Maternal Aunt   . Depression Maternal Aunt    Past Surgical History:  Procedure Laterality Date  . 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 Influenza,inj,Quad PF,6+ Mos 06/10/2018, 06/22/2019  . Tdap 06/22/2019     Objective: Vital Signs: There were no vitals taken for this visit.   Physical Exam   Musculoskeletal Exam: ***  CDAI Exam: CDAI Score: - Patient Global: -; Provider Global: - Swollen: -; Tender: - Joint Exam 10/22/2019   No joint exam has been documented for this visit   There is currently no information documented on the homunculus. Go to the Rheumatology activity and complete the homunculus joint exam.  Investigation: No additional findings.  Imaging: No results found.  Recent Labs: Lab Results  Component Value Date   WBC 6.4 06/08/2019   HGB 10.7 (L) 06/08/2019   PLT 292 06/08/2019   NA 138 06/08/2019   K 4.3 06/08/2019   CL 105 06/08/2019   CO2 27 06/08/2019   GLUCOSE 97 06/08/2019   BUN 6 (L) 06/08/2019   CREATININE 0.67 06/08/2019   BILITOT 0.2 06/08/2019   ALKPHOS 67 03/02/2015   AST 16 06/08/2019   ALT 18 06/08/2019   PROT 6.9 07/27/2019   ALBUMIN 4.9 03/02/2015   CALCIUM 9.0 06/08/2019   GFRAA 147 06/08/2019   July 27, 2019 UA showed trace hemoglobin, SPEP consistent with acute inflammatory pattern, C3-C4 normal, ENA negative, CK 62, HLA-B27 positive  06/08/19: ANA 1:40 NS, RF<14, CCP<16, ESR 14, CRP 3.3, TSH 1.42, T3 21, T3 10.4, Hep C-, HIV-, lipids WNL Speciality Comments: No specialty comments available.  Procedures:  No procedures performed Allergies: Patient has no known allergies.   Assessment / Plan:     Visit Diagnoses: No diagnosis found.  Orders: No orders of the defined types were placed in this encounter.  No orders of the defined types were placed in this encounter.   Face-to-face time spent with patient was *** minutes. Greater than 50% of time was spent in counseling and coordination of care.  Follow-Up Instructions: No follow-ups on file.   Bo Merino, MD  Note - This record has been created using Radio producer.  Chart creation errors have been sought, but may not always  have been located. Such creation errors do not reflect on  the standard of medical care.

## 2019-10-22 ENCOUNTER — Ambulatory Visit: Payer: Managed Care, Other (non HMO) | Admitting: Rheumatology

## 2019-12-30 ENCOUNTER — Other Ambulatory Visit: Payer: Self-pay

## 2019-12-30 ENCOUNTER — Telehealth (INDEPENDENT_AMBULATORY_CARE_PROVIDER_SITE_OTHER): Payer: 59 | Admitting: Psychiatry

## 2019-12-30 DIAGNOSIS — F3341 Major depressive disorder, recurrent, in partial remission: Secondary | ICD-10-CM | POA: Diagnosis not present

## 2019-12-30 DIAGNOSIS — Z760 Encounter for issue of repeat prescription: Secondary | ICD-10-CM

## 2019-12-30 DIAGNOSIS — F4010 Social phobia, unspecified: Secondary | ICD-10-CM | POA: Diagnosis not present

## 2019-12-30 DIAGNOSIS — F411 Generalized anxiety disorder: Secondary | ICD-10-CM

## 2019-12-30 MED ORDER — BUSPIRONE HCL 15 MG PO TABS: 15.0000 mg | ORAL_TABLET | Freq: Two times a day (BID) | ORAL | 5 refills | Status: DC

## 2019-12-30 MED ORDER — HYDROXYZINE HCL 50 MG PO TABS: 50.0000 mg | ORAL_TABLET | Freq: Every evening | ORAL | 5 refills | Status: DC | PRN

## 2019-12-30 MED ORDER — DULOXETINE HCL 60 MG PO CPEP: 60.0000 mg | ORAL_CAPSULE | Freq: Every day | ORAL | 1 refills | Status: DC

## 2019-12-30 NOTE — Progress Notes (Signed)
BH MD/PA/NP OP Progress Note  12/30/2019 3:08 PM Sharon Kelly  MRN:  [id number] Interview was conducted using videoconferencing application and I verified that I was speaking with the correct person using two identifiers. I discussed the limitations of evaluation and management by telemedicine and  the availability of in person appointments. Patient expressed understanding and agreed to proceed.  Chief Complaint: "I am doing well".  HPI: 19 yo SWF with hx of depressed mood (improved some), generalized anxiety (excessive worrying, ruminations, racing thoughts) as well as panic attacks when she had to be in public. She has no hx of mania, psychosis, suicidal ideation or psychiatric admissions., She does not abuse alcohol or street drugs. 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 She reports some benefit from Effexor but was concerned about gradual weight gain. Sleep has improved on hydroxyzine and she has only been taking one 50 mg dose (I recommended taking two caps). We have changed Effexor to Cymbalta 60 mg and she reports further improvement in mood and no change in appetite/weight. She will return to school [employer]) on August 18. She has not received COVID vaccine yet and is "on the fence" about getting one. She just returned from a trip with her BF to [place].  Visit Diagnosis:    ICD-10-CM   1. GAD (generalized anxiety disorder)  F41.1   2. Social anxiety disorder  F40.10   3. Major depressive disorder, recurrent episode, in partial remission (HCC)  F33.41   4. Prescription refill  Z76.0 busPIRone (BUSPAR) 15 MG tablet    Past Psychiatric History: Please see intake H&P.  Past Medical History:  Past Medical History:  Diagnosis Date  . Anxiety   . Depression     Past Surgical History:  Procedure Laterality Date  . CHOLECYSTECTOMY N/A 01/11/2015   Procedure: LAPAROSCOPIC CHOLECYSTECTOMY WITH  INTRAOPERATIVE CHOLANGIOGRAM;  Surgeon: Earline Mayotte, MD;  Location: [facility] ORS;  Service: General;  Laterality: N/A;    Family Psychiatric History: Reviewed.  Family History:  Family History  Problem Relation Age of Onset  . Diabetes Mother   . Cholelithiasis Mother   . Depression Mother   . Cholelithiasis Paternal Grandfather   . Depression Sister   . Anxiety disorder Sister   . Anxiety disorder Maternal Aunt   . Depression Maternal Aunt     Social History:  Social History   Socioeconomic History  . Marital status: Single    Spouse name: Not on file  . Number of children: Not on file  . Years of education: Not on file  . Highest education level: Not on file  Occupational History  . Occupation: fulltrime student  Tobacco Use  . Smoking status: Never Smoker  . Smokeless tobacco: Never Used  Substance and Sexual Activity  . Alcohol use: No    Alcohol/week: 0.0 standard drinks  . Drug use: No  . Sexual activity: Never    Partners: Male    Birth control/protection: Pill  Other Topics Concern  . 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 Difficulty of Paying Living Expenses: Not hard at all  Food Insecurity: No Food Insecurity  . Worried About Programme researcher, broadcasting/film/video in the Last Year: Never true  . Ran Out  of Food in the Last Year: Never true  Transportation Needs: No Transportation Needs  . Lack of Transportation (Medical): No  . Lack of Transportation (Non-Medical): No  Physical Activity: Insufficiently Active  . Days of Exercise per Week: 2 days  . Minutes of Exercise per Session: 30 min  Stress: No Stress Concern Present  . Feeling of Stress : Not at all  Social Connections: Somewhat Isolated  . Frequency of Communication with Friends and Family: More than three times a week  . Frequency of Social Gatherings with  Friends and Family: Never  . Attends Religious Services: More than 4 times per year  . Active Member of Clubs or Organizations: No  . Attends Archivist Meetings: Never  . Marital Status: Never married    Allergies: No Known Allergies  Metabolic Disorder Labs: No results found for: HGBA1C, MPG No results found for: PROLACTIN Lab Results  Component Value Date   CHOL 169 06/08/2019   TRIG 121 06/08/2019   HDL 54 06/08/2019   CHOLHDL 3.1 06/08/2019   [place] 93 06/08/2019   Lab Results  Component Value Date   TSH 1.42 06/08/2019    Therapeutic Level Labs: No results found for: LITHIUM No results found for: VALPROATE No components found for:  CBMZ  Current Medications: Current Outpatient Medications  Medication Sig Dispense Refill  . busPIRone (BUSPAR) 15 MG tablet Take 1 tablet (15 mg total) by mouth 2 (two) times daily. 60 tablet 5  . Cholecalciferol (VITAMIN D3) 50 MCG (2000 UT) TABS Take by mouth daily.    . DULoxetine (CYMBALTA) 60 MG capsule Take 1 capsule (60 mg total) by mouth daily. 90 capsule 1  . esomeprazole (NEXIUM) 20 MG capsule Take 20 mg by mouth daily at 12 noon.    . hydrOXYzine (ATARAX/VISTARIL) 50 MG tablet Take 1 tablet (50 mg total) by mouth at bedtime as needed and may repeat dose one time if needed (insomnia). 60 tablet 5  . Norgestimate-Ethinyl Estradiol Triphasic (TRI-SPRINTEC) 0.18/0.215/0.25 MG-35 MCG tablet Take 1 tablet by mouth daily. 84 tablet 3   No current facility-administered medications for this visit.    Psychiatric Specialty Exam: Review of Systems  Psychiatric/Behavioral: The patient is nervous/anxious.   All other systems reviewed and are negative.   There were no vitals taken for this visit.There is no height or weight on file to calculate BMI.  General Appearance: Casual and Well Groomed  Eye Contact:  Good  Speech:  Clear and Coherent and Normal Rate  Volume:  Normal  Mood:  Minimal anxiety.  Affect:  Full Range   Thought Process:  Goal Directed and Linear  Orientation:  Full (Time, Place, and Person)  Thought Content: Logical   Suicidal Thoughts:  No  Homicidal Thoughts:  No  Memory:  Immediate;   Good Recent;   Good Remote;   Good  Judgement:  Fair  Insight:  Fair  Psychomotor Activity:  Normal  Concentration:  Concentration: Good  Recall:  Good  Fund of Knowledge: Good  Language: Good  Akathisia:  Negative  Handed:  Right  AIMS (if indicated): not done  Assets:  Communication Skills Desire for Improvement Financial Resources/Insurance Housing Intimacy Social Support Talents/Skills Vocational/Educational  ADL's:  Intact  Cognition: WNL  Sleep:  Good   Screenings: GAD-7     [facility]2019 [facility]  Total GAD-7 Score  8    PHQ2-9     [facility]08[facility] in [facility]  PHQ-2 Total Score  2  PHQ-9 Total Score  7       Assessment and Plan: 19 yo SWF with hx of depressed mood (improved some), generalized anxiety (excessive worrying, ruminations, racing thoughts) as well as panic attacks when she had to be in public. She has no hx of mania, psychosis, suicidal ideation or psychiatric admissions., She does not abuse alcohol or street drugs. 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 She reports some benefit from Effexor but was concerned about gradual weight gain. Sleep has improved on hydroxyzine and she has only been taking one 50 mg dose (I recommended taking two caps). We have changed Effexor to Cymbalta 60 mg and she reports further improvement in mood and no change in appetite/weight. She will return to school [employer]) on August 18. She has not received COVID vaccine yet and is "on the fence" about getting one.  Dx: GAD; Social anxiety disorder; MDD recurrent in partial remission  Plan: Continue buspirone 15 mg bid, hydroxyzine to  50 mg prn insomnia, duloxetine 60 mg daily. Next appointment in 3 months or prn.The plan was discussed with patient who had an opportunity to ask questions and these were all answered. I spend19minutes in videoconferencing with the patient.    Magdalene Patricia, MD 12/30/2019, 3:08 PM

## 2020-01-19 ENCOUNTER — Telehealth (HOSPITAL_COMMUNITY): Payer: Self-pay

## 2020-01-19 NOTE — Telephone Encounter (Signed)
Patient's mom called and stated that patient is in a very depressive state and that she's concerned about her. She stated that she has a hard time getting her out of the house. I transferred mom to the fd to see if she could get a sooner appointment than the one she has on 8/12 but I don't think she could. Mom asked if you could call her at [phone] or Nevae at [phone] when you get back. I did inform mom that if it gets really bad for [facility] and she doesn't think she can hold on until you get back to please take her to the emergency room. Thank you.

## 2020-03-11 ENCOUNTER — Encounter (HOSPITAL_COMMUNITY): Payer: Self-pay

## 2020-03-24 ENCOUNTER — Other Ambulatory Visit (HOSPITAL_COMMUNITY): Payer: Self-pay | Admitting: Psychiatry

## 2020-03-31 ENCOUNTER — Telehealth (INDEPENDENT_AMBULATORY_CARE_PROVIDER_SITE_OTHER): Payer: Self-pay | Admitting: Psychiatry

## 2020-03-31 ENCOUNTER — Other Ambulatory Visit: Payer: Self-pay

## 2020-03-31 DIAGNOSIS — F411 Generalized anxiety disorder: Secondary | ICD-10-CM

## 2020-03-31 DIAGNOSIS — F4010 Social phobia, unspecified: Secondary | ICD-10-CM

## 2020-03-31 DIAGNOSIS — F331 Major depressive disorder, recurrent, moderate: Secondary | ICD-10-CM

## 2020-03-31 MED ORDER — DULOXETINE HCL 60 MG PO CPEP: 60.0000 mg | ORAL_CAPSULE | Freq: Two times a day (BID) | ORAL | 1 refills | Status: DC

## 2020-03-31 NOTE — Progress Notes (Signed)
BH MD/PA/NP OP Progress Note  03/31/2020 3:16 PM Sharon Kelly  MRN:  [id number] Interview was conducted using videoconferencing application and I verified that I was speaking with the correct person using two identifiers. I discussed the limitations of evaluation and management by telemedicine and  the availability of in person appointments. Patient expressed understanding and agreed to proceed. Patient location - home; physician - home office.  Chief Complaint: More depressed for past two months.  HPI: 19 yo SWF with hx of depressed mood (improved some), generalized anxiety (excessive worrying, ruminations, racing thoughts) as well as panic attacks when she had to be in public. She has no hx of mania, psychosis, suicidal ideation or psychiatric admissions., She does not abuse alcohol or street drugs. 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 She reports some benefit from Effexor but was concerned about gradual weight gain. Sleep has improved on hydroxyzineand she has only been taking one 50 mg dose (I recommended taking two caps). We have changed Effexor to Cymbalta 60 mg and she reported further improvement in mood and no change in appetite/weight. IN late May however her boyfriend broke up with her (wanted to fucus on studies) and that setback  Triggered increase in depression and anxiety. Furthermore, two of her family members passed away in the past in month of August bringing back sad memories. She will return to school [employer]) on August 18 but most classes will be virtual. She will live at home and commute. She has not received COVID vaccine yet and is "on the fence" about getting one.  Visit Diagnosis:    ICD-10-CM   1. Major depressive disorder, recurrent episode, moderate (HCC)  F33.1   2. GAD (generalized anxiety disorder)  F41.1   3. Social anxiety disorder  F40.10     Past Psychiatric History: Please see  intake H&P.  Past Medical History:  Past Medical History:  Diagnosis Date  . Anxiety   . Depression     Past Surgical History:  Procedure Laterality Date  . CHOLECYSTECTOMY N/A 01/11/2015   Procedure: LAPAROSCOPIC CHOLECYSTECTOMY WITH INTRAOPERATIVE CHOLANGIOGRAM;  Surgeon: Earline Mayotte, MD;  Location: [facility] ORS;  Service: General;  Laterality: N/A;    Family Psychiatric History: Reviewed.  Family History:  Family History  Problem Relation Age of Onset  . Diabetes Mother   . Cholelithiasis Mother   . Depression Mother   . Cholelithiasis Paternal Grandfather   . Depression Sister   . Anxiety disorder Sister   . Anxiety disorder Maternal Aunt   . Depression Maternal Aunt     Social History:  Social History   Socioeconomic History  . Marital status: Single    Spouse name: Not on file  . Number of children: Not on file  . Years of education: Not on file  . Highest education level: Not on file  Occupational History  . Occupation: fulltrime student  Tobacco Use  . Smoking status: Never Smoker  . Smokeless tobacco: Never Used  Vaping Use  . Vaping Use: Never used  Substance and Sexual Activity  . Alcohol use: No    Alcohol/week: 0.0 standard drinks  . Drug use: No  . Sexual activity: Never    Partners: Male    Birth control/protection: Pill  Other Topics Concern  . 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 Difficulty of Paying Living Expenses: Not hard at all  Food Insecurity: No Food Insecurity  . Worried About Programme researcher, broadcasting/film/video in the Last Year: Never true  . Ran Out of Food in the Last Year: Never true  Transportation Needs: No Transportation Needs  . Lack of Transportation (Medical): No  . Lack of Transportation (Non-Medical): No  Physical Activity: Insufficiently Active  . Days of Exercise per Week: 2  days  . Minutes of Exercise per Session: 30 min  Stress: No Stress Concern Present  . Feeling of Stress : Not at all  Social Connections: Moderately Isolated  . Frequency of Communication with Friends and Family: More than three times a week  . Frequency of Social Gatherings with Friends and Family: Never  . Attends Religious Services: More than 4 times per year  . Active Member of Clubs or Organizations: No  . Attends Banker Meetings: Never  . Marital Status: Never married    Allergies: No Known Allergies  Metabolic Disorder Labs: No results found for: HGBA1C, MPG No results found for: PROLACTIN Lab Results  Component Value Date   CHOL 169 06/08/2019   TRIG 121 06/08/2019   HDL 54 06/08/2019   CHOLHDL 3.1 06/08/2019   LDLCALC 93 06/08/2019   Lab Results  Component Value Date   TSH 1.42 06/08/2019    Therapeutic Level Labs: No results found for: LITHIUM No results found for: VALPROATE No components found for:  CBMZ  Current Medications: Current Outpatient Medications  Medication Sig Dispense Refill  . busPIRone (BUSPAR) 15 MG tablet Take 1 tablet (15 mg total) by mouth 2 (two) times daily. 60 tablet 5  . Cholecalciferol (VITAMIN D3) 50 MCG (2000 UT) TABS Take by mouth daily.    . DULoxetine (CYMBALTA) 60 MG capsule Take 1 capsule (60 mg total) by mouth 2 (two) times daily. 60 capsule 1  . esomeprazole (NEXIUM) 20 MG capsule Take 20 mg by mouth daily at 12 noon.    . hydrOXYzine (ATARAX/VISTARIL) 50 MG tablet Take 1 tablet (50 mg total) by mouth at bedtime as needed and may repeat dose one time if needed (insomnia). 60 tablet 5  . Norgestimate-Ethinyl Estradiol Triphasic (TRI-SPRINTEC) 0.18/0.215/0.25 MG-35 MCG tablet Take 1 tablet by mouth daily. 84 tablet 3   No current facility-administered medications for this visit.     Psychiatric Specialty Exam: Review of Systems  Psychiatric/Behavioral: Positive for sleep disturbance. The patient is  nervous/anxious.   All other systems reviewed and are negative.   There were no vitals taken for this visit.There is no height or weight on file to calculate BMI.  General Appearance: Casual and Well Groomed  Eye Contact:  Good  Speech:  Clear and Coherent and Normal Rate  Volume:  Normal  Mood:  Anxious and Depressed  Affect:  Congruent and Constricted  Thought Process:  Goal Directed  Orientation:  Full (Time, Place, and Person)  Thought Content: Rumination   Suicidal Thoughts:  No  Homicidal Thoughts:  No  Memory:  Immediate;   Good Recent;   Good Remote;   Good  Judgement:  Good  Insight:  Good  Psychomotor Activity:  Normal  Concentration:  Concentration: Fair  Recall:  Good  Fund of Knowledge: Good  Language: Good  Akathisia:  Negative  Handed:  Right  AIMS (if indicated): not done  Assets:  Communication Skills Desire  for Improvement Housing Physical Health Social Support Vocational/Educational  ADL's:  Intact  Cognition: WNL  Sleep:  Fair   Screenings: GAD-7     Office Visit from 06/08/2019 in [facility]  Total GAD-7 Score 8    PHQ2-9     Office Visit from 06/08/2019 in [facility]  PHQ-2 Total Score 2  PHQ-9 Total Score 7       Assessment and Plan:  19 yo SWF with hx of depressed mood (improved some), generalized anxiety (excessive worrying, ruminations, racing thoughts) as well as panic attacks when she had to be in public. She has no hx of mania, psychosis, suicidal ideation or psychiatric admissions., She does not abuse alcohol or street drugs. 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 She reports some benefit from Effexor but was concerned about gradual weight gain. Sleep has improved on hydroxyzineand she has only been taking one 50 mg dose until recently when she started taking two. We have changed Effexor to Cymbalta 60 mg and she  reported further improvement in mood and no change in appetite/weight. In late May however her boyfriend broke up with her (wanted to fucus on studies) and that setback  Triggered increase in depression and anxiety. Furthermore, two of her family members passed away in the past in month of August bringing back sad memories. She will return to school [employer]) on August 18 but most classes will be virtual. She will live at home and commute.   Dx: GAD; Social anxiety disorder; MDD recurrent moderate  Plan: Continue buspirone 15 mg bid, hydroxyzine to 50-100 mg prn insomnia, increase duloxetine to 60 mg bid.Next appointment inone month.The plan was discussed with patient who had an opportunity to ask questions and these were all answered. I spend21minutes invideoconferencingwith the patient.   Magdalene Patricia, MD 03/31/2020, 3:16 PM

## 2020-04-04 ENCOUNTER — Telehealth (HOSPITAL_COMMUNITY): Payer: Self-pay | Admitting: Psychiatry

## 2020-05-03 ENCOUNTER — Telehealth (INDEPENDENT_AMBULATORY_CARE_PROVIDER_SITE_OTHER): Payer: Self-pay | Admitting: Psychiatry

## 2020-05-03 ENCOUNTER — Other Ambulatory Visit: Payer: Self-pay

## 2020-05-03 DIAGNOSIS — Z760 Encounter for issue of repeat prescription: Secondary | ICD-10-CM

## 2020-05-03 DIAGNOSIS — F4010 Social phobia, unspecified: Secondary | ICD-10-CM

## 2020-05-03 DIAGNOSIS — F330 Major depressive disorder, recurrent, mild: Secondary | ICD-10-CM

## 2020-05-03 DIAGNOSIS — F411 Generalized anxiety disorder: Secondary | ICD-10-CM

## 2020-05-03 MED ORDER — HYDROXYZINE HCL 50 MG PO TABS: 50.0000 mg | ORAL_TABLET | Freq: Every evening | ORAL | 5 refills | Status: DC | PRN

## 2020-05-03 MED ORDER — BUSPIRONE HCL 15 MG PO TABS: 15.0000 mg | ORAL_TABLET | Freq: Two times a day (BID) | ORAL | 5 refills | Status: DC

## 2020-05-03 MED ORDER — DULOXETINE HCL 60 MG PO CPEP: 60.0000 mg | ORAL_CAPSULE | Freq: Two times a day (BID) | ORAL | 5 refills | Status: DC

## 2020-05-03 NOTE — Progress Notes (Signed)
BH MD/PA/NP OP Progress Note  05/03/2020 3:11 PM Sharon Kelly  MRN:  [id number] Interview was conducted using videoconferencing application and I verified that I was speaking with the correct person using two identifiers. I discussed the limitations of evaluation and management by telemedicine and  the availability of in person appointments. Patient expressed understanding and agreed to proceed. Patient location - home; physician - home office.  Chief Complaint: "I feel better".  HPI: 19yo SWF withhx of depressed mood (improved some), generalized anxiety (excessive worrying, ruminations, racing thoughts) as well as panic attacks when she had to be in public. She has no hx of mania, psychosis, suicidal ideation or psychiatric admissions., She does not abuse alcohol or street drugs. 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 She reports some benefit from Effexor butwas concerned about gradual weight gain. Sleep has improved on hydroxyzineand she has only been taking one 50 mg dose until recently when she started taking two. We have changed Effexor to Cymbalta 60 mg and she reported further improvement in mood and no change in appetite/weight. In late May however her boyfriend broke up with her (wanted to fucus on studies) and that setback  Triggered increase in depression and anxiety. Furthermore, two of her family members passed away in the past in month of August bringing back sad memories. She is back at [employer] in person classes 3 days a week: lives at home and commutes.    Visit Diagnosis:    ICD-10-CM   1. GAD (generalized anxiety disorder)  F41.1   2. Prescription refill  Z76.0 busPIRone (BUSPAR) 15 MG tablet  3. Major depressive disorder, recurrent episode, mild (HCC)  F33.0   4. Social anxiety disorder  F40.10     Past Psychiatric History: Please see intake H&P.  Past Medical History:  Past Medical  History:  Diagnosis Date  . Anxiety   . Depression     Past Surgical History:  Procedure Laterality Date  . CHOLECYSTECTOMY N/A 01/11/2015   Procedure: LAPAROSCOPIC CHOLECYSTECTOMY WITH INTRAOPERATIVE CHOLANGIOGRAM;  Surgeon: Earline Mayotte, MD;  Location: [facility] ORS;  Service: General;  Laterality: N/A;    Family Psychiatric History: Reviewed.  Family History:  Family History  Problem Relation Age of Onset  . Diabetes Mother   . Cholelithiasis Mother   . Depression Mother   . Cholelithiasis Paternal Grandfather   . Depression Sister   . Anxiety disorder Sister   . Anxiety disorder Maternal Aunt   . Depression Maternal Aunt     Social History:  Social History   Socioeconomic History  . Marital status: Single    Spouse name: Not on file  . Number of children: Not on file  . Years of education: Not on file  . Highest education level: Not on file  Occupational History  . Occupation: fulltrime student  Tobacco Use  . Smoking status: Never Smoker  . Smokeless tobacco: Never Used  Vaping Use  . Vaping Use: Never used  Substance and Sexual Activity  . Alcohol use: No    Alcohol/week: 0.0 standard drinks  . Drug use: No  . Sexual activity: Never    Partners: Male    Birth control/protection: Pill  Other Topics Concern  . 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 Difficulty of Paying Living Expenses: Not hard at all  Food Insecurity: No Food Insecurity  . Worried About Programme researcher, broadcasting/film/video in the Last Year: Never true  . Ran Out of Food in the Last Year: Never true  Transportation Needs: No Transportation Needs  . Lack of Transportation (Medical): No  . Lack of Transportation (Non-Medical): No  Physical Activity: Insufficiently Active  . Days of Exercise per Week: 2 days  . Minutes of Exercise per Session: 30 min   Stress: No Stress Concern Present  . Feeling of Stress : Not at all  Social Connections: Moderately Isolated  . Frequency of Communication with Friends and Family: More than three times a week  . Frequency of Social Gatherings with Friends and Family: Never  . Attends Religious Services: More than 4 times per year  . Active Member of Clubs or Organizations: No  . Attends Banker Meetings: Never  . Marital Status: Never married    Allergies: No Known Allergies  Metabolic Disorder Labs: No results found for: HGBA1C, MPG No results found for: PROLACTIN Lab Results  Component Value Date   CHOL 169 06/08/2019   TRIG 121 06/08/2019   HDL 54 06/08/2019   CHOLHDL 3.1 06/08/2019   LDLCALC 93 06/08/2019   Lab Results  Component Value Date   TSH 1.42 06/08/2019    Therapeutic Level Labs: No results found for: LITHIUM No results found for: VALPROATE No components found for:  CBMZ  Current Medications: Current Outpatient Medications  Medication Sig Dispense Refill  . busPIRone (BUSPAR) 15 MG tablet Take 1 tablet (15 mg total) by mouth 2 (two) times daily. 60 tablet 5  . Cholecalciferol (VITAMIN D3) 50 MCG (2000 UT) TABS Take by mouth daily.    . DULoxetine (CYMBALTA) 60 MG capsule Take 1 capsule (60 mg total) by mouth 2 (two) times daily. 60 capsule 5  . esomeprazole (NEXIUM) 20 MG capsule Take 20 mg by mouth daily at 12 noon.    . hydrOXYzine (ATARAX/VISTARIL) 50 MG tablet Take 1 tablet (50 mg total) by mouth at bedtime as needed and may repeat dose one time if needed (insomnia). 60 tablet 5  . Norgestimate-Ethinyl Estradiol Triphasic (TRI-SPRINTEC) 0.18/0.215/0.25 MG-35 MCG tablet Take 1 tablet by mouth daily. 84 tablet 3   No current facility-administered medications for this visit.    Psychiatric Specialty Exam: Review of Systems  Psychiatric/Behavioral: The patient is nervous/anxious.   All other systems reviewed and are negative.   There were no vitals  taken for this visit.There is no height or weight on file to calculate BMI.  General Appearance: Casual, well groomed  Eye Contact:  Good  Speech:  Clear and Coherent and Normal Rate  Volume:  Normal  Mood:  Anxious  Affect:  Full Range  Thought Process:  Goal Directed  Orientation:  Full (Time, Place, and Person)  Thought Content: Logical   Suicidal Thoughts:  No  Homicidal Thoughts:  No  Memory:  Immediate;   Good Recent;   Good Remote;   Good  Judgement:  Good  Insight:  Good  Psychomotor Activity:  Normal  Concentration:  Concentration: Good  Recall:  Good  Fund of Knowledge: Good  Language: Good  Akathisia:  Negative  Handed:  Right  AIMS (if indicated): not done  Assets:  Communication Skills Desire for Improvement Financial Resources/Insurance Housing Physical Health Social Support Talents/Skills  ADL's:  Intact  Cognition: WNL  Sleep:  Good  Screenings: GAD-7     Office Visit from 06/08/2019 in [facility]  Total GAD-7 Score 8    PHQ2-9     Office Visit from 06/08/2019 in [facility]  PHQ-2 Total Score 2  PHQ-9 Total Score 7       Assessment and Plan: 19yo SWF withhx of depressed mood (improved some), generalized anxiety (excessive worrying, ruminations, racing thoughts) as well as panic attacks when she had to be in public. She has no hx of mania, psychosis, suicidal ideation or psychiatric admissions., She does not abuse alcohol or street drugs. 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 She reports some benefit from Effexor butwas concerned about gradual weight gain. Sleep has improved on hydroxyzineand she has only been taking one 50 mg dose until recently when she started taking two. We have changed Effexor to Cymbalta 60 mg and she reported further improvement in mood and no change in appetite/weight. In late May however her boyfriend  broke up with her (wanted to fucus on studies) and that setback triggered increase in depression and anxiety. Furthermore, two of her family members passed away in the past in month of August bringing back sad memories. She is back at [employer] in person classes 3 days a week: lives at home and commutes.   Dx: GAD; Social anxiety disorder; MDD recurrentmoderate  Plan: Continue buspirone 15 mg bid, hydroxyzine to 50-100 mg prn insomnia, duloxetine to 60 mg bid.Next appointment inthree months.The plan was discussed with patient who had an opportunity to ask questions and these were all answered. I spend26minutes invideoconferencingwith the patient.    Magdalene Patricia, MD 05/03/2020, 3:11 PM

## 2020-06-27 ENCOUNTER — Other Ambulatory Visit: Payer: Self-pay

## 2020-06-27 ENCOUNTER — Other Ambulatory Visit (HOSPITAL_COMMUNITY)
Admission: RE | Admit: 2020-06-27 | Discharge: 2020-06-27 | Disposition: A | Discharge status: Home / Self Care | Payer: Self-pay | Source: Ambulatory Visit | Attending: Obstetrics and Gynecology | Admitting: Obstetrics and Gynecology

## 2020-06-27 ENCOUNTER — Encounter: Payer: Self-pay | Admitting: Obstetrics and Gynecology

## 2020-06-27 ENCOUNTER — Ambulatory Visit (INDEPENDENT_AMBULATORY_CARE_PROVIDER_SITE_OTHER): Payer: BC Managed Care – PPO | Admitting: Obstetrics and Gynecology

## 2020-06-27 VITALS — BP 130/90 | Ht 60.0 in | Wt 176.0 lb

## 2020-06-27 DIAGNOSIS — Z01419 Encounter for gynecological examination (general) (routine) without abnormal findings: Secondary | ICD-10-CM

## 2020-06-27 DIAGNOSIS — Z23 Encounter for immunization: Secondary | ICD-10-CM | POA: Diagnosis not present

## 2020-06-27 DIAGNOSIS — Z124 Encounter for screening for malignant neoplasm of cervix: Secondary | ICD-10-CM

## 2020-06-27 DIAGNOSIS — Z113 Encounter for screening for infections with a predominantly sexual mode of transmission: Secondary | ICD-10-CM

## 2020-06-27 DIAGNOSIS — Z3041 Encounter for surveillance of contraceptive pills: Secondary | ICD-10-CM

## 2020-06-27 MED ORDER — NORGESTIM-ETH ESTRAD TRIPHASIC 0.18/0.215/0.25 MG-35 MCG PO TABS: 1.0000 | ORAL_TABLET | Freq: Every day | ORAL | 3 refills | Status: DC

## 2020-06-27 NOTE — Progress Notes (Signed)
PCP:  Doren Custard, FNP   Chief Complaint  Patient presents with  . Gynecologic Exam  . Injections    flu     HPI:      Ms. Sharon Kelly is a 19 y.o. No obstetric history on file. who LMP was Patient's last menstrual period was 06/22/2020 (exact date)., presents today for her annual examination.  Her menses are regular every 28-30 days, lasting 4 days. Dysmenorrhea mild, ibup improves sx. She does not have intermenstrual bleeding.  Sex activity: not currently sexually active--contraception OCPs  Hx of STDs: none  There is a FH of breast cancer in her pat grt aunt, genetic testing not indicated. There is no FH of ovarian cancer. The patient does not do self-breast exams.  Tobacco use: The patient denies current or previous tobacco use. Alcohol use: none No drug use.  Exercise: moderately active  She does get adequate calcium but not Vitamin D in her diet.  Did 2 Gardasil injections but then recalled, so not doing the 3rd.   Pt seeing Dr. Hinton Dyer for anxiety/depression. Doing well, likes him a lot   Past Medical History:  Diagnosis Date  . Anxiety   . Depression     Past Surgical History:  Procedure Laterality Date  . CHOLECYSTECTOMY N/A 01/11/2015   Procedure: LAPAROSCOPIC CHOLECYSTECTOMY WITH INTRAOPERATIVE CHOLANGIOGRAM;  Surgeon: Earline Mayotte, MD;  Location: [facility] ORS;  Service: General;  Laterality: N/A;    Family History  Problem Relation Age of Onset  . Diabetes Mother   . Cholelithiasis Mother   . Depression Mother   . Cholelithiasis Paternal Grandfather   . Depression Sister   . Anxiety disorder Sister   . Anxiety disorder Maternal Aunt   . Depression Maternal Aunt     Social History   Socioeconomic History  . Marital status: Single    Spouse name: Not on file  . Number of children: Not on file  . Years of education: Not on file  . Highest education level: Not on file  Occupational History  . Occupation: fulltrime student  Tobacco  Use  . Smoking status: Never Smoker  . Smokeless tobacco: Never Used  Vaping Use  . Vaping Use: Never used  Substance and Sexual Activity  . Alcohol use: No    Alcohol/week: 0.0 standard drinks  . Drug use: No  . Sexual activity: Not Currently    Partners: Male    Birth control/protection: Pill  Other Topics Concern  . 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 Difficulty of Paying Living Expenses: Not on file  Food Insecurity:   . Worried About Programme researcher, broadcasting/film/video in the Last Year: Not on file  . Ran Out of Food in the Last Year: Not on file  Transportation Needs:   . Lack of Transportation (Medical): Not on file  . Lack of Transportation (Non-Medical): Not on file  Physical Activity:   . Days of Exercise per Week: Not on file  . Minutes of Exercise per Session: Not on file  Stress:   . Feeling of Stress : Not on file  Social Connections:   . Frequency of Communication with Friends and Family: Not on file  . Frequency of Social Gatherings with Friends and Family: Not on file  . Attends Religious Services: Not on file  .  Active Member of Clubs or Organizations: Not on file  . Attends Banker Meetings: Not on file  . Marital Status: Not on file  Intimate Partner Violence:   . Fear of Current or Ex-Partner: Not on file  . Emotionally Abused: Not on file  . Physically Abused: Not on file  . Sexually Abused: Not on file     Current Outpatient Medications:  .  busPIRone (BUSPAR) 15 MG tablet, Take 1 tablet (15 mg total) by mouth 2 (two) times daily., Disp: 60 tablet, Rfl: 5 .  DULoxetine (CYMBALTA) 60 MG capsule, Take 1 capsule (60 mg total) by mouth 2 (two) times daily., Disp: 60 capsule, Rfl: 5 .  esomeprazole (NEXIUM) 20 MG capsule, Take 20 mg by mouth daily at 12 noon., Disp: , Rfl:  .  hydrOXYzine (ATARAX/VISTARIL) 50  MG tablet, Take 1 tablet (50 mg total) by mouth at bedtime as needed and may repeat dose one time if needed (insomnia)., Disp: 60 tablet, Rfl: 5 .  Norgestimate-Ethinyl Estradiol Triphasic (TRI-SPRINTEC) 0.18/0.215/0.25 MG-35 MCG tablet, Take 1 tablet by mouth daily., Disp: 84 tablet, Rfl: 3     ROS:  Review of Systems  Constitutional: Negative for fatigue, fever and unexpected weight change.  Respiratory: Negative for cough, shortness of breath and wheezing.   Cardiovascular: Negative for chest pain, palpitations and leg swelling.  Gastrointestinal: Negative for blood in stool, constipation, diarrhea, nausea and vomiting.  Endocrine: Negative for cold intolerance, heat intolerance and polyuria.  Genitourinary: Negative for dyspareunia, dysuria, flank pain, frequency, genital sores, hematuria, menstrual problem, pelvic pain, urgency, vaginal bleeding, vaginal discharge and vaginal pain.  Musculoskeletal: Positive for arthralgias. Negative for back pain, joint swelling and myalgias.  Skin: Negative for rash.  Neurological: Negative for dizziness, syncope, light-headedness, numbness and headaches.  Hematological: Negative for adenopathy.  Psychiatric/Behavioral: Positive for agitation and decreased concentration. Negative for confusion, sleep disturbance and suicidal ideas. The patient is not nervous/anxious.   BREAST: No symptoms   Objective: BP 130/90   Ht 5' (1.524 m)   Wt 176 lb (79.8 kg)   LMP 06/22/2020 (Exact Date)   BMI 34.37 kg/m    Physical Exam Constitutional:      Appearance: She is well-developed.  Genitourinary:     Vulva, vagina, cervix, uterus, right adnexa and left adnexa normal.     No vulval lesion or tenderness noted.     No vaginal discharge, erythema or tenderness.     No cervical polyp.     Uterus is not enlarged or tender.     No right or left adnexal mass present.     Right adnexa not tender.     Left adnexa not tender.  Neck:     Thyroid: No  thyromegaly.  Cardiovascular:     Rate and Rhythm: Normal rate and regular rhythm.     Heart sounds: Normal heart sounds. No murmur heard.   Pulmonary:     Effort: Pulmonary effort is normal.     Breath sounds: Normal breath sounds.  Chest:     Breasts:        Right: No mass, nipple discharge, skin change or tenderness.        Left: No mass, nipple discharge, skin change or tenderness.  Abdominal:     Palpations: Abdomen is soft.     Tenderness: There is no abdominal tenderness. There is no guarding.  Musculoskeletal:        General: Normal range of motion.  Cervical back: Normal range of motion.  Neurological:     General: No focal deficit present.     Mental Status: She is alert and oriented to person, place, and time.     Cranial Nerves: No cranial nerve deficit.     Sensory: No sensory deficit.  Skin:    General: Skin is warm and dry.  Psychiatric:        Mood and Affect: Mood normal.        Behavior: Behavior normal.        Thought Content: Thought content normal.        Judgment: Judgment normal.  Vitals reviewed.     Assessment/Plan: Encounter for annual routine gynecological examination  Cervical cancer screening - Plan: Cytology - PAP  Screening for STD (sexually transmitted disease) - Plan: Cytology - PAP  Encounter for surveillance of contraceptive pills - Plan: Norgestimate-Ethinyl Estradiol Triphasic (TRI-SPRINTEC) 0.18/0.215/0.25 MG-35 MCG tablet; OCP RF   Meds ordered this encounter  Medications  . Norgestimate-Ethinyl Estradiol Triphasic (TRI-SPRINTEC) 0.18/0.215/0.25 MG-35 MCG tablet    Sig: Take 1 tablet by mouth daily.    Dispense:  84 tablet    Refill:  3    Order Specific Question:   Supervising Provider    Answer:   Nadara Mustard [[id number]]             GYN counsel adequate intake of calcium and vitamin D, diet and exercise     F/U  Return in about 1 year (around 06/27/2021).  Devaney Segers B. Presleigh Feldstein, PA-C 06/27/2020 1:45 PM

## 2020-06-27 NOTE — Patient Instructions (Signed)
I value your feedback and entrusting us with your care. If you get a [place] patient survey, I would appreciate you taking the time to let us know about your experience today. Thank you!  As of July 30, 2019, your lab results will be released to your MyChart immediately, before I even have a chance to see them. Please give me time to review them and contact you if there are any abnormalities. Thank you for your patience.  

## 2020-06-30 LAB — CYTOLOGY - PAP
Chlamydia: NEGATIVE
Comment: NEGATIVE
Comment: NORMAL
Diagnosis: NEGATIVE
Neisseria Gonorrhea: NEGATIVE

## 2020-08-01 ENCOUNTER — Other Ambulatory Visit: Payer: Self-pay

## 2020-08-01 ENCOUNTER — Telehealth (INDEPENDENT_AMBULATORY_CARE_PROVIDER_SITE_OTHER): Payer: BC Managed Care – PPO | Admitting: Psychiatry

## 2020-08-01 DIAGNOSIS — F411 Generalized anxiety disorder: Secondary | ICD-10-CM

## 2020-08-01 DIAGNOSIS — F3342 Major depressive disorder, recurrent, in full remission: Secondary | ICD-10-CM | POA: Diagnosis not present

## 2020-08-01 DIAGNOSIS — F4010 Social phobia, unspecified: Secondary | ICD-10-CM | POA: Diagnosis not present

## 2020-08-01 MED ORDER — TRAZODONE HCL 100 MG PO TABS: 100.0000 mg | ORAL_TABLET | Freq: Every evening | ORAL | 2 refills | Status: DC | PRN

## 2020-08-01 NOTE — Progress Notes (Signed)
BH MD/PA/NP OP Progress Note  08/01/2020 3:13 PM Sharon Kelly  MRN:  [id number] Interview was conducted using videoconferencing application and I verified that I was speaking with the correct person using two identifiers. I discussed the limitations of evaluation and management by telemedicine and  the availability of in person appointments. Patient expressed understanding and agreed to proceed. Participants in the visit: patient (location - home); physician (location - home office).  Chief Complaint: Middle insomnia.  HPI: 19yo SWF withhx of depressed mood (improved some), generalized anxiety (excessive worrying, ruminations, racing thoughts) as well as panic attacks when she had to be in public. She has no hx of mania, psychosis, suicidal ideation or psychiatric admissions., She does not abuse alcohol or street drugs. 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 She reports some benefit from Effexor butwas concerned about gradual weight gain. Sleep has improved on hydroxyzineand she has only been taking one 50 mg doseuntil recently when she started taking two.She still wakes uip after about 5 hours and cannot fall asleep again. We have changed Effexor to Cymbalta 60 mg and she reportedfurther improvement in mood and no change in appetite/weight.  She is on a Winter break  at [employer] till third week in January: lives at home and commutes.   Visit Diagnosis:    ICD-10-CM   1. GAD (generalized anxiety disorder)  F41.1   2. Social anxiety disorder  F40.10   3. Major depressive disorder, recurrent episode, in full remission (HCC)  F33.42     Past Psychiatric History: Please see intake H&P.  Past Medical History:  Past Medical History:  Diagnosis Date  . Anxiety   . Depression     Past Surgical History:  Procedure Laterality Date  . CHOLECYSTECTOMY N/A 01/11/2015   Procedure: LAPAROSCOPIC CHOLECYSTECTOMY  WITH INTRAOPERATIVE CHOLANGIOGRAM;  Surgeon: Earline Mayotte, MD;  Location: [facility] ORS;  Service: General;  Laterality: N/A;    Family Psychiatric History: Reviewed.  Family History:  Family History  Problem Relation Age of Onset  . Diabetes Mother   . Cholelithiasis Mother   . Depression Mother   . Cholelithiasis Paternal Grandfather   . Depression Sister   . Anxiety disorder Sister   . Anxiety disorder Maternal Aunt   . Depression Maternal Aunt     Social History:  Social History   Socioeconomic History  . Marital status: Single    Spouse name: Not on file  . Number of children: Not on file  . Years of education: Not on file  . Highest education level: Not on file  Occupational History  . Occupation: fulltrime student  Tobacco Use  . Smoking status: Never Smoker  . Smokeless tobacco: Never Used  Vaping Use  . Vaping Use: Never used  Substance and Sexual Activity  . Alcohol use: No    Alcohol/week: 0.0 standard drinks  . Drug use: No  . Sexual activity: Not Currently    Partners: Male    Birth control/protection: Pill  Other Topics Concern  . 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   Social Determinants of Health   Financial Resource Strain: Not on file  Food Insecurity: Not on file  Transportation Needs: Not on file  Physical Activity: Not on file  Stress: Not on file  Social Connections: Not on file  Allergies: No Known Allergies  Metabolic Disorder Labs: No results found for: HGBA1C, MPG No results found for: PROLACTIN Lab Results  Component Value Date   CHOL 169 06/08/2019   TRIG 121 06/08/2019   HDL 54 06/08/2019   CHOLHDL 3.1 06/08/2019   LDLCALC 93 06/08/2019   Lab Results  Component Value Date   TSH 1.42 06/08/2019    Therapeutic Level Labs: No results found for: LITHIUM No results found for: VALPROATE No components found for:  CBMZ  Current  Medications: Current Outpatient Medications  Medication Sig Dispense Refill  . busPIRone (BUSPAR) 15 MG tablet Take 1 tablet (15 mg total) by mouth 2 (two) times daily. 60 tablet 5  . DULoxetine (CYMBALTA) 60 MG capsule Take 1 capsule (60 mg total) by mouth 2 (two) times daily. 60 capsule 5  . esomeprazole (NEXIUM) 20 MG capsule Take 20 mg by mouth daily at 12 noon.    . hydrOXYzine (ATARAX/VISTARIL) 50 MG tablet Take 1 tablet (50 mg total) by mouth at bedtime as needed and may repeat dose one time if needed (insomnia). 60 tablet 5  . Norgestimate-Ethinyl Estradiol Triphasic (TRI-SPRINTEC) 0.18/0.215/0.25 MG-35 MCG tablet Take 1 tablet by mouth daily. 84 tablet 3  . traZODone (DESYREL) 100 MG tablet Take 1 tablet (100 mg total) by mouth at bedtime as needed for sleep. 30 tablet 2   No current facility-administered medications for this visit.      Psychiatric Specialty Exam: Review of Systems  Psychiatric/Behavioral: Positive for sleep disturbance.  All other systems reviewed and are negative.   There were no vitals taken for this visit.There is no height or weight on file to calculate BMI.  General Appearance: Casual and Well Groomed  Eye Contact:  Good  Speech:  Clear and Coherent and Normal Rate  Volume:  Normal  Mood:  Euthymic  Affect:  Full Range  Thought Process:  Goal Directed and Linear  Orientation:  Full (Time, Place, and Person)  Thought Content: Logical   Suicidal Thoughts:  No  Homicidal Thoughts:  No  Memory:  Immediate;   Good Recent;   Good Remote;   Good  Judgement:  Good  Insight:  Good  Psychomotor Activity:  Normal  Concentration:  Concentration: Good  Recall:  Good  Fund of Knowledge: Good  Language: Good  Akathisia:  Negative  Handed:  Right  AIMS (if indicated): not done  Assets:  Communication Skills Desire for Improvement Financial Resources/Insurance Housing Physical Health Social Support Vocational/Educational  ADL's:  Intact  Cognition:  WNL  Sleep:  Fair   Screenings: GAD-7   Flowsheet Row [facility] Visit [facility]08/2019 in [facility]  Total GAD-7 Score 8    PHQ2-9   Flowsheet Row Office Visit from 06/08/2019 in [facility]  PHQ-2 Total Score 2  PHQ-9 Total Score 7       Assessment and Plan: 19yo SWF withhx of depressed mood (improved some), generalized anxiety (excessive worrying, ruminations, racing thoughts) as well as panic attacks when she had to be in public. She has no hx of mania, psychosis, suicidal ideation or psychiatric admissions., She does not abuse alcohol or street drugs. 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 She reports some benefit from Effexor butwas concerned about gradual weight gain. Sleep has improved on hydroxyzineand she has only been taking one 50 mg doseuntil recently when she started taking two.She still wakes uip  after about 5 hours and cannot fall asleep again. We have changed Effexor to Cymbalta 60 mg and she reportedfurther improvement in mood and no change in appetite/weight.  She is on a Winter break  at [employer] [employer] till third week in January: lives at home and commutes.  Dx: GAD; Social anxiety disorder; MDD recurrentin remission  Plan: Continue buspirone 15 mg bid, duloxetine60 mgbid.We will try trazodone for insomnia. Next appointment inthreemonths.The plan was discussed with patient who had an opportunity to ask questions and these were all answered. I spend79minutes invideoconferencingwith the patient.   Magdalene Patricia, MD 08/01/2020, 3:13 PM

## 2020-08-25 DIAGNOSIS — J029 Acute pharyngitis, unspecified: Secondary | ICD-10-CM | POA: Diagnosis not present

## 2020-08-29 DIAGNOSIS — Z20822 Contact with and (suspected) exposure to covid-19: Secondary | ICD-10-CM | POA: Diagnosis not present

## 2020-10-26 ENCOUNTER — Other Ambulatory Visit: Payer: Self-pay

## 2020-10-26 ENCOUNTER — Telehealth (INDEPENDENT_AMBULATORY_CARE_PROVIDER_SITE_OTHER): Payer: BC Managed Care – PPO | Admitting: Psychiatry

## 2020-10-26 DIAGNOSIS — F3342 Major depressive disorder, recurrent, in full remission: Secondary | ICD-10-CM

## 2020-10-26 DIAGNOSIS — F4010 Social phobia, unspecified: Secondary | ICD-10-CM

## 2020-10-26 DIAGNOSIS — Z760 Encounter for issue of repeat prescription: Secondary | ICD-10-CM

## 2020-10-26 DIAGNOSIS — F411 Generalized anxiety disorder: Secondary | ICD-10-CM

## 2020-10-26 MED ORDER — BUSPIRONE HCL 15 MG PO TABS: 15.0000 mg | ORAL_TABLET | Freq: Two times a day (BID) | ORAL | 5 refills | Status: AC

## 2020-10-26 MED ORDER — TRAZODONE HCL 100 MG PO TABS: 100.0000 mg | ORAL_TABLET | Freq: Every evening | ORAL | 5 refills | Status: AC | PRN

## 2020-10-26 MED ORDER — DULOXETINE HCL 60 MG PO CPEP: 60.0000 mg | ORAL_CAPSULE | Freq: Two times a day (BID) | ORAL | 5 refills | Status: AC

## 2020-10-26 NOTE — Progress Notes (Signed)
BH MD/PA/NP OP Progress Note  10/26/2020 3:11 PM Sharon Kelly  MRN:  [id number] Interview was conducted using videoconferencing application and I verified that I was speaking with the correct person using two identifiers. I discussed the limitations of evaluation and management by telemedicine and  the availability of in person appointments. Patient expressed understanding and agreed to proceed. Participants in the visit: patient (location - home); physician (location - home office).  Chief Complaint: None.  HPI: 20yo SWF withhx of depressed mood (improved some), generalized anxiety (excessive worrying, ruminations, racing thoughts) as well as panic attacks when she had to be in public. She has no hx of mania, psychosis, suicidal ideation or psychiatric admissions., She does not abuse alcohol or street drugs. 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 She reports some benefit from Effexor butwas concerned about gradual weight gain. Sleep has improved on trazodone which she takes every night. We have changed Effexor to Cymbalta 60 mg and she reportedfurther improvement in mood and no change in appetite/weight.  [facility], livesat home and commutes (only one day a week, other classes are virtual).   Visit Diagnosis:    ICD-10-CM   1. GAD (generalized anxiety disorder)  F41.1   2. Prescription refill  Z76.0 busPIRone (BUSPAR) 15 MG tablet  3. Major depressive disorder, recurrent episode, in full remission (HCC)  F33.42   4. Social anxiety disorder  F40.10     Past Psychiatric History: Please see intake H&P.  Past Medical History:  Past Medical History:  Diagnosis Date  . Anxiety   . Depression     Past Surgical History:  Procedure Laterality Date  . CHOLECYSTECTOMY N/A 01/11/2015   Procedure: LAPAROSCOPIC CHOLECYSTECTOMY WITH INTRAOPERATIVE CHOLANGIOGRAM;  Surgeon: Earline Mayotte, MD;   Location: [facility] ORS;  Service: General;  Laterality: N/A;    Family Psychiatric History: Reviewed.  Family History:  Family History  Problem Relation Age of Onset  . Diabetes Mother   . Cholelithiasis Mother   . Depression Mother   . Cholelithiasis Paternal Grandfather   . Depression Sister   . Anxiety disorder Sister   . Anxiety disorder Maternal Aunt   . Depression Maternal Aunt     Social History:  Social History   Socioeconomic History  . Marital status: Single    Spouse name: Not on file  . Number of children: Not on file  . Years of education: Not on file  . Highest education level: Not on file  Occupational History  . Occupation: fulltrime student  Tobacco Use  . Smoking status: Never Smoker  . Smokeless tobacco: Never Used  Vaping Use  . Vaping Use: Never used  Substance and Sexual Activity  . Alcohol use: No    Alcohol/week: 0.0 standard drinks  . Drug use: No  . Sexual activity: Not Currently    Partners: Male    Birth control/protection: Pill  Other Topics Concern  . Not on file  Social History Narrative   [employer] Full time Student will transfer to [employer] in 2021 [employer]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   Social Determinants of Health   Financial Resource Strain: Not on file  Food Insecurity: Not on file  Transportation Needs: Not on file  Physical Activity: Not on file  Stress: Not on file  Social Connections: Not on file    Allergies: No Known Allergies  Metabolic Disorder Labs: No results found for: HGBA1C, MPG No  results found for: PROLACTIN Lab Results  Component Value Date   CHOL 169 06/08/2019   TRIG 121 06/08/2019   HDL 54 06/08/2019   CHOLHDL 3.1 06/08/2019   LDLCALC 93 06/08/2019   Lab Results  Component Value Date   TSH 1.42 06/08/2019    Therapeutic Level Labs: No results found for: LITHIUM No results found for: VALPROATE No components found for:  CBMZ  Current Medications: Current Outpatient Medications  Medication Sig Dispense Refill   . busPIRone (BUSPAR) 15 MG tablet Take 1 tablet (15 mg total) by mouth 2 (two) times daily. 60 tablet 5  . DULoxetine (CYMBALTA) 60 MG capsule Take 1 capsule (60 mg total) by mouth 2 (two) times daily. 60 capsule 5  . esomeprazole (NEXIUM) 20 MG capsule Take 20 mg by mouth daily at 12 noon.    . Norgestimate-Ethinyl Estradiol Triphasic (TRI-SPRINTEC) 0.18/0.215/0.25 MG-35 MCG tablet Take 1 tablet by mouth daily. 84 tablet 3  . traZODone (DESYREL) 100 MG tablet Take 1 tablet (100 mg total) by mouth at bedtime as needed for sleep. 30 tablet 5   No current facility-administered medications for this visit.      Psychiatric Specialty Exam: Review of Systems  All other systems reviewed and are negative.   There were no vitals taken for this visit.There is no height or weight on file to calculate BMI.  General Appearance: Casual  Eye Contact:  Good  Speech:  Clear and Coherent and Normal Rate  Volume:  Normal  Mood:  Euthymic  Affect:  Full Range  Thought Process:  Goal Directed and Linear  Orientation:  Full (Time, Place, and Person)  Thought Content: Logical   Suicidal Thoughts:  No  Homicidal Thoughts:  No  Memory:  Immediate;   Good Recent;   Good Remote;   Good  Judgement:  Good  Insight:  Good  Psychomotor Activity:  Normal  Concentration:  Concentration: Good  Recall:  Good  Fund of Knowledge: Good  Language: Good  Akathisia:  Negative  Handed:  Right  AIMS (if indicated): not done  Assets:  Communication Skills Desire for Improvement Financial Resources/Insurance Housing Physical Health Social Support Vocational/Educational  ADL's:  Intact  Cognition: WNL  Sleep:  Good   Screenings: GAD-7   Flowsheet Row Office Visit from 06/08/2019 in [facility]  Total GAD-7 Score 8    PHQ2-9   Flowsheet Row Office Visit from 06/08/2019 in [facility]  PHQ-2 Total Score 2  PHQ-9 Total Score 7       Assessment and Plan: 20yo  SWF withhx of depressed mood (improved some), generalized anxiety (excessive worrying, ruminations, racing thoughts) as well as panic attacks when she had to be in public. She has no hx of mania, psychosis, suicidal ideation or psychiatric admissions., She does not abuse alcohol or street drugs. 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 She reports some benefit from Effexor butwas concerned about gradual weight gain. Sleep has improved on trazodone which she takes every night. We have changed Effexor to Cymbalta 60 mg and she reportedfurther improvement in mood and no change in appetite/weight.  Shestudies at [employer], livesat home and commutes (only one day a week, other classes are virtual).  Dx: GAD; Social anxiety disorder; MDD recurrentin remission  Plan: Continue buspirone 15 mg bid, duloxetine60 mgbid and trazodone for insomnia. Next appointment inthreemonths with a new provider.The plan was  discussed with patient who had an opportunity to ask questions and these were all answered. I spend73minutes invideoconferencingwith the patient.    Magdalene Patricia, MD 10/26/2020, 3:11 PM

## 2021-05-17 DIAGNOSIS — F411 Generalized anxiety disorder: Secondary | ICD-10-CM | POA: Diagnosis not present

## 2021-05-17 DIAGNOSIS — Z23 Encounter for immunization: Secondary | ICD-10-CM | POA: Diagnosis not present

## 2021-05-31 DIAGNOSIS — R059 Cough, unspecified: Secondary | ICD-10-CM | POA: Diagnosis not present

## 2021-05-31 DIAGNOSIS — R509 Fever, unspecified: Secondary | ICD-10-CM | POA: Diagnosis not present

## 2021-07-04 DIAGNOSIS — Z0000 Encounter for general adult medical examination without abnormal findings: Secondary | ICD-10-CM | POA: Diagnosis not present

## 2021-07-04 DIAGNOSIS — K219 Gastro-esophageal reflux disease without esophagitis: Secondary | ICD-10-CM | POA: Diagnosis not present

## 2021-07-04 DIAGNOSIS — M542 Cervicalgia: Secondary | ICD-10-CM | POA: Diagnosis not present

## 2021-07-04 DIAGNOSIS — F5104 Psychophysiologic insomnia: Secondary | ICD-10-CM | POA: Diagnosis not present

## 2021-07-04 DIAGNOSIS — F411 Generalized anxiety disorder: Secondary | ICD-10-CM | POA: Diagnosis not present

## 2022-11-27 ENCOUNTER — Ambulatory Visit (INDEPENDENT_AMBULATORY_CARE_PROVIDER_SITE_OTHER): Payer: BC Managed Care – PPO | Admitting: Gastroenterology

## 2022-11-27 ENCOUNTER — Encounter: Payer: Self-pay | Admitting: Gastroenterology

## 2022-11-27 VITALS — BP 119/85 | HR 107 | Temp 98.7°F | Ht 60.0 in | Wt 183.0 lb

## 2022-11-27 DIAGNOSIS — K219 Gastro-esophageal reflux disease without esophagitis: Secondary | ICD-10-CM | POA: Diagnosis not present

## 2022-11-27 NOTE — Patient Instructions (Signed)
Referral to Dr. Everlene Farrier  [address], [place], [place] [address] [phone]

## 2022-11-27 NOTE — Progress Notes (Signed)
Gastroenterology Consultation  Referring Provider:     Doren Custard, FNP Primary Care Physician:  Doren Custard, FNP Primary Gastroenterologist:  Dr. Servando Snare     Reason for Consultation:     GERD        HPI:   Sharon Kelly is a 22 y.o. y/o female referred for consultation & management of GERD by Dr. Annye Asa, Gerome Apley, FNP.  This patient is here after seeing me back in 2018 for regurgitation and reflux.  The patient states that her Nexium has helped her greatly and she takes it at night.  She also reports that she does not eat for a few hours before she goes to sleep.  Despite that the patient still has regurgitation which goes up to her throat and causes her to have a tickle in the throat which causes her to cough and then resulting in vomiting.  There is no report of any unexplained weight loss fevers chills nausea vomiting black stools or bloody stools.  She also denies the feeling of food getting stuck.  Past Medical History:  Diagnosis Date   Anxiety    Depression     Past Surgical History:  Procedure Laterality Date   CHOLECYSTECTOMY N/A 01/11/2015   Procedure: LAPAROSCOPIC CHOLECYSTECTOMY WITH INTRAOPERATIVE CHOLANGIOGRAM;  Surgeon: Earline Mayotte, MD;  Location: [facility] ORS;  Service: General;  Laterality: N/A;    Prior to Admission medications   Medication Sig Start Date End Date Taking? Authorizing Provider  DULoxetine (CYMBALTA) 60 MG capsule Take 1 capsule (60 mg total) by mouth 2 (two) times daily. 10/26/20 04/24/21  Pucilowski, Roosvelt Maser, MD  esomeprazole (NEXIUM) 20 MG capsule Take 20 mg by mouth daily at 12 noon.    [provider]  Norgestimate-Ethinyl Estradiol Triphasic (TRI-SPRINTEC) 0.18/0.215/0.25 MG-35 MCG tablet Take 1 tablet by mouth daily. 06/27/20   Copland, Ilona Sorrel, PA-C  traZODone (DESYREL) 100 MG tablet Take 1 tablet (100 mg total) by mouth at bedtime as needed for sleep. 10/26/20 04/24/21  Pucilowski, Roosvelt Maser, MD    Family History  Problem Relation  Age of Onset   Diabetes Mother    Cholelithiasis Mother    Depression Mother    Cholelithiasis Paternal Grandfather    Depression Sister    Anxiety disorder Sister    Anxiety disorder Maternal Aunt    Depression Maternal Aunt      Social History   Tobacco Use   Smoking status: Never   Smokeless tobacco: Never  Vaping Use   Vaping Use: Never used  Substance Use Topics   Alcohol use: No    Alcohol/week: 0.0 standard drinks of alcohol   Drug use: No    Allergies as of 11/27/2022   (No Known Allergies)    Review of Systems:    All systems reviewed and negative except where noted in HPI.   Physical Exam:  There were no vitals taken for this visit. No LMP recorded. General:   Alert,  Well-developed, well-nourished, pleasant and cooperative in NAD Head:  Normocephalic and atraumatic. Eyes:  Sclera clear, no icterus.   Conjunctiva pink. Ears:  Normal auditory acuity. Neck:  Supple; no masses or thyromegaly. Lungs:  Respirations even and unlabored.  Clear throughout to auscultation.   No wheezes, crackles, or rhonchi. No acute distress. Heart:  Regular rate and rhythm; no murmurs, clicks, rubs, or gallops. Abdomen:  Normal bowel sounds.  No bruits.  Soft, non-tender and non-distended without masses, hepatosplenomegaly or hernias noted.  No guarding or rebound tenderness.  Negative Carnett sign.   Rectal:  Deferred.  Pulses:  Normal pulses noted. Extremities:  No clubbing or edema.  No cyanosis. Neurologic:  Alert and oriented x3;  grossly normal neurologically. Skin:  Intact without significant lesions or rashes.  No jaundice. Lymph Nodes:  No significant cervical adenopathy. Psych:  Alert and cooperative. Normal mood and affect.  Imaging Studies: No results found.  Assessment and Plan:   Sharon Kelly is a 22 y.o. y/o female who comes in today with a history of GERD which has helped with her Nexium.  The patient also has regurgitation mostly when she is laying down at  night which causes her to choke and then cough with resulting vomiting.  The patient has been told to keep the medication and she will also be referred over to surgery, Dr. Everlene Farrier, for possible antireflux surgery.  The patient has been explained the plan agrees with it.    Midge Minium, MD. Clementeen Graham    Note: This dictation was prepared with Dragon dictation along with smaller phrase technology. Any transcriptional errors that result from this process are unintentional.

## 2022-12-10 ENCOUNTER — Ambulatory Visit (INDEPENDENT_AMBULATORY_CARE_PROVIDER_SITE_OTHER): Payer: BC Managed Care – PPO | Admitting: Surgery

## 2022-12-10 ENCOUNTER — Encounter: Payer: Self-pay | Admitting: Surgery

## 2022-12-10 VITALS — BP 136/90 | HR 108 | Temp 98.9°F | Ht 60.0 in | Wt 183.6 lb

## 2022-12-10 DIAGNOSIS — K219 Gastro-esophageal reflux disease without esophagitis: Secondary | ICD-10-CM | POA: Diagnosis not present

## 2022-12-10 NOTE — Patient Instructions (Addendum)
Your CT is scheduled for 12/19/2022 8am (arrive by 7:45am) at [facility] [address]. Nothing to eat or drink  hours prior.   Your Barium Swallow test is scheduled for 12/26/2022 9am (arrive by 8:5am) at [facility]. Nothing to eat or drink 3 hours prior.   A referral has been placed to [facility] for an upper endoscopy They will call you for an appointment.    If you have any concerns or questions, please feel free to call our office.   Gastroesophageal Reflux Disease, Adult  Gastroesophageal reflux (GER) happens when acid from the stomach flows up into the tube that connects the mouth and the stomach (esophagus). Normally, food travels down the esophagus and stays in the stomach to be digested. With GER, food and stomach acid sometimes move back up into the esophagus. You may have a disease called gastroesophageal reflux disease (GERD) if the reflux: Happens often. Causes frequent or very bad symptoms. Causes problems such as damage to the esophagus. When this happens, the esophagus becomes sore and swollen. Over time, GERD can make small holes (ulcers) in the lining of the esophagus. What are the causes? This condition is caused by a problem with the muscle between the esophagus and the stomach. When this muscle is weak or not normal, it does not close properly to keep food and acid from coming back up from the stomach. The muscle can be weak because of: Tobacco use. Pregnancy. Having a certain type of hernia (hiatal hernia). Alcohol use. Certain foods and drinks, such as coffee, chocolate, onions, and peppermint. What increases the risk? Being overweight. Having a disease that affects your connective tissue. Taking NSAIDs, such a ibuprofen. What are the signs or symptoms? Heartburn. Difficult or painful swallowing. The feeling of having a lump in the throat. A bitter taste in the mouth. Bad breath. Having a lot of saliva. Having an upset or bloated  stomach. Burping. Chest pain. Different conditions can cause chest pain. Make sure you see your doctor if you have chest pain. Shortness of breath or wheezing. A long-term cough or a cough at night. Wearing away of the surface of teeth (tooth enamel). Weight loss. How is this treated? Making changes to your diet. Taking medicine. Having surgery. Treatment will depend on how bad your symptoms are. Follow these instructions at home: Eating and drinking  Follow a diet as told by your doctor. You may need to avoid foods and drinks such as: Coffee and tea, with or without caffeine. Drinks that contain alcohol. Energy drinks and sports drinks. Bubbly (carbonated) drinks or sodas. Chocolate and cocoa. Peppermint and mint flavorings. Garlic and onions. Horseradish. Spicy and acidic foods. These include peppers, chili powder, curry powder, vinegar, hot sauces, and BBQ sauce. Citrus fruit juices and citrus fruits, such as oranges, lemons, and limes. Tomato-based foods. These include red sauce, chili, salsa, and pizza with red sauce. Fried and fatty foods. These include donuts, french fries, potato chips, and high-fat dressings. High-fat meats. These include hot dogs, rib eye steak, sausage, ham, and bacon. High-fat dairy items, such as whole milk, butter, and cream cheese. Eat small meals often. Avoid eating large meals. Avoid drinking large amounts of liquid with your meals. Avoid eating meals during the 2-3 hours before bedtime. Avoid lying down right after you eat. Do not exercise right after you eat. Lifestyle  Do not smoke or use any products that contain nicotine or tobacco. If you need help quitting, ask your doctor. Try to lower  your stress. If you need help doing this, ask your doctor. If you are overweight, lose an amount of weight that is healthy for you. Ask your doctor about a safe weight loss goal. General instructions Pay attention to any changes in your symptoms. Take  over-the-counter and prescription medicines only as told by your doctor. Do not take aspirin, ibuprofen, or other NSAIDs unless your doctor says it is okay. Wear loose clothes. Do not wear anything tight around your waist. Raise (elevate) the head of your bed about 6 inches (15 cm). You may need to use a wedge to do this. Avoid bending over if this makes your symptoms worse. Keep all follow-up visits. Contact a doctor if: You have new symptoms. You lose weight and you do not know why. You have trouble swallowing or it hurts to swallow. You have wheezing or a cough that keeps happening. You have a hoarse voice. Your symptoms do not get better with treatment. Get help right away if: You have sudden pain in your arms, neck, jaw, teeth, or back. You suddenly feel sweaty, dizzy, or light-headed. You have chest pain or shortness of breath. You vomit and the vomit is green, yellow, or black, or it looks like blood or coffee grounds. You faint. Your poop (stool) is red, bloody, or black. You cannot swallow, drink, or eat. These symptoms may represent a serious problem that is an emergency. Do not wait to see if the symptoms will go away. Get medical help right away. Call your local emergency services (911 in the [place] Do not drive yourself to the hospital. Summary If a person has gastroesophageal reflux disease (GERD), food and stomach acid move back up into the esophagus and cause symptoms or problems such as damage to the esophagus. Treatment will depend on how bad your symptoms are. Follow a diet as told by your doctor. Take all medicines only as told by your doctor. This information is not intended to replace advice given to you by your health care provider. Make sure you discuss any questions you have with your health care provider. Document Revised: 02/15/2020 Document Reviewed: 02/15/2020 Elsevier Patient Education  2023 [employer].

## 2022-12-11 ENCOUNTER — Encounter: Payer: Self-pay | Admitting: Surgery

## 2022-12-11 NOTE — Progress Notes (Signed)
Patient ID: Sharon Kelly, female   DOB: 11/01/2000, 22 y.o.   MRN: [id number]  HPI Sharon Kelly is a 22 y.o. female seen in consultation at the request of Dr.Wohl for constant reflux.  Her most concerning symptoms is regurgitation.  She is states that sometimes she gets regurgitation in the middle of the night and has to clean her bed sheets.  She also had some cough and vomiting that night.  She has been taking Nexium with some control of the reflux symptoms.  Patient does not have dysphagia.  She has followed behavioral modification for reflux and continues to have episode of regurgitation. He did have a prior history of laparoscopic cholecystectomy by Dr. Lemar Livings 8 years ago or so He is able to perform more than 4 METS of activity without any shortness of breath or chest pain.  She is accompanied by her boyfriend She was seen several years ago by [facility] gastroenterology due to abdominal pain.  He has never had an endoscopy performed. Does have some general anxiety disorder and takes buspirone Years ago she had CBC and CBC that were completely normal.  She denies any easy bruising. She Is a Administrator, arts in high school. HPI  Past Medical History:  Diagnosis Date   Anxiety    Depression     Past Surgical History:  Procedure Laterality Date   CHOLECYSTECTOMY N/A 01/11/2015   Procedure: LAPAROSCOPIC CHOLECYSTECTOMY WITH INTRAOPERATIVE CHOLANGIOGRAM;  Surgeon: Earline Mayotte, MD;  Location: [facility] ORS;  Service: General;  Laterality: N/A;    Family History  Problem Relation Age of Onset   Diabetes Mother    Cholelithiasis Mother    Depression Mother    Cholelithiasis Paternal Grandfather    Depression Sister    Anxiety disorder Sister    Anxiety disorder Maternal Aunt    Depression Maternal Aunt     Social History Social History   Tobacco Use   Smoking status: Never   Smokeless tobacco: Never  Vaping Use   Vaping Use: Never used  Substance Use Topics   Alcohol use: No     Alcohol/week: 0.0 standard drinks of alcohol   Drug use: No    No Known Allergies  Current Outpatient Medications  Medication Sig Dispense Refill   busPIRone (BUSPAR) 15 MG tablet Take 15 mg by mouth 2 (two) times daily.     Cholecalciferol (VITAMIN D3) 50 MCG (2000 UT) CAPS Take by mouth.     DULoxetine (CYMBALTA) 60 MG capsule Take 1 capsule (60 mg total) by mouth 2 (two) times daily. 60 capsule 5   esomeprazole (NEXIUM) 40 MG capsule Take 40 mg by mouth daily.     loratadine (CLARITIN) 10 MG tablet Take 10 mg by mouth daily.     Norethindrone-Ethinyl Estradiol-Fe Biphas (LO LOESTRIN FE) 1 MG-10 MCG / 10 MCG tablet Take 1 tablet by mouth daily.     traZODone (DESYREL) 100 MG tablet Take 1 tablet (100 mg total) by mouth at bedtime as needed for sleep. 30 tablet 5   No current facility-administered medications for this visit.     Review of Systems Full ROS  was asked and was negative except for the information on the HPI  Physical Exam Blood pressure (!) 136/90, pulse (!) 108, temperature 98.9 F (37.2 C), temperature source Oral, height 5' (1.524 m), weight 183 lb 9.6 oz (83.3 kg), SpO2 98 %. CONSTITUTIONAL: NAD. EYES: Pupils are equal, round, and reactive to light, Sclera are non-icteric. EARS, NOSE, MOUTH  AND THROAT: The oropharynx is clear. The oral mucosa is pink and moist. Hearing is intact to voice. LYMPH NODES:  Lymph nodes in the neck are normal. RESPIRATORY:  Lungs are clear. There is normal respiratory effort, with equal breath sounds bilaterally, and without pathologic use of accessory muscles. CARDIOVASCULAR: Heart is regular without murmurs, gallops, or rubs. GI: The abdomen is  soft, nontender, and nondistended. There are no palpable masses. There is no hepatosplenomegaly. There are normal bowel sounds  GU: Rectal deferred.   MUSCULOSKELETAL: Normal muscle strength and tone. No cyanosis or edema.   SKIN: Turgor is good and there are no pathologic skin lesions or  ulcers. NEUROLOGIC: Motor and sensation is grossly normal. Cranial nerves are grossly intact. PSYCH:  Oriented to person, place and time. Affect is normal.  Data Reviewed  I have personally reviewed the patient's imaging, laboratory findings and medical records.    Assessment/Plan 22 year old female with significant reflux and regurgitation symptoms.  Patient is interested in antireflux procedure given her quality of life issues associated to the reflux.  Discussed further workup and we will obtain CT of the abdomen and pelvis to perform preoperative planning as well as barium swallow to assess esophageal function and motility indirectly.  I will also request EGD.  Currently no need for emergent surgical intervention.  I did encourage some optimization of her weight. We also talked about different options to control reflux to include medication lifestyle modification as well as surgical therapy.  We did talk about Nissen fundoplication.  The risk the benefits and the possible complications to include bloating diarrhea and adhering strictly to postoperative diet.  She still seems interested. This report was sent to the referring provider  Sterling Big, MD FACS General Surgeon 12/11/2022, 3:15 PM

## 2022-12-12 ENCOUNTER — Other Ambulatory Visit: Payer: Self-pay

## 2022-12-12 ENCOUNTER — Telehealth: Payer: Self-pay

## 2022-12-12 DIAGNOSIS — K219 Gastro-esophageal reflux disease without esophagitis: Secondary | ICD-10-CM

## 2022-12-12 NOTE — Telephone Encounter (Signed)
Patient left a voicemail stating she was referred to general surgery by Dr. Servando Snare and Dr. Everlene Farrier wants to schedule a EGD. Patient would like to get this schedule

## 2022-12-13 ENCOUNTER — Encounter: Payer: Self-pay | Admitting: Gastroenterology

## 2022-12-13 NOTE — Anesthesia Preprocedure Evaluation (Addendum)
Anesthesia Evaluation  Patient identified by MRN, date of birth, ID band Patient awake    Reviewed: Allergy & Precautions, H&P , NPO status , Patient's Chart, lab work & pertinent test results  Airway Mallampati: II  TM Distance: >3 FB Neck ROM: full    Dental no notable dental hx.    Pulmonary neg pulmonary ROS   Pulmonary exam normal        Cardiovascular negative cardio ROS Normal cardiovascular exam  Had heart murmur at birth, but Epic reports it is "resolved"    Neuro/Psych  PSYCHIATRIC DISORDERS Anxiety Depression    negative neurological ROS     GI/Hepatic Neg liver ROS,GERD  Poorly Controlled,,  Endo/Other  negative endocrine ROS    Renal/GU negative Renal ROS  negative genitourinary   Musculoskeletal   Abdominal  (+) + obese  Peds  Hematology negative hematology ROS (+)   Anesthesia Other Findings Past Medical History: No date: Anxiety No date: Depression No date: GERD (gastroesophageal reflux disease) No date: Heart murmur     Comment:  at birth.  Resolved No date: Scoliosis  Past Surgical History: 01/11/2015: CHOLECYSTECTOMY; N/A     Comment:  Procedure: LAPAROSCOPIC CHOLECYSTECTOMY WITH               INTRAOPERATIVE CHOLANGIOGRAM;  Surgeon: Earline Mayotte, MD;  Location: [facility] ORS;  Service: General;                Laterality: N/A;  BMI    Body Mass Index: 35.52 kg/m      Reproductive/Obstetrics negative OB ROS                             Anesthesia Physical Anesthesia Plan  ASA: 2  Anesthesia Plan: General   Post-op Pain Management:    Induction: Intravenous  PONV Risk Score and Plan: Propofol infusion and TIVA  Airway Management Planned: Natural Airway  Additional Equipment:   Intra-op Plan:   Post-operative Plan:   Informed Consent: I have reviewed the patients History and Physical, chart, labs and discussed the procedure including  the risks, benefits and alternatives for the proposed anesthesia with the patient or authorized representative who has indicated his/her understanding and acceptance.     Dental Advisory Given  Plan Discussed with: CRNA and Surgeon  Anesthesia Plan Comments:        Anesthesia Quick Evaluation

## 2022-12-19 ENCOUNTER — Ambulatory Visit
Admission: RE | Admit: 2022-12-19 | Discharge: 2022-12-19 | Disposition: A | Discharge status: Home / Self Care | Payer: BC Managed Care – PPO | Source: Ambulatory Visit | Attending: Surgery | Admitting: Surgery

## 2022-12-19 DIAGNOSIS — K219 Gastro-esophageal reflux disease without esophagitis: Secondary | ICD-10-CM

## 2022-12-19 MED ORDER — IOHEXOL 300 MG/ML  SOLN
85.0000 mL | Freq: Once | INTRAMUSCULAR | Status: AC | PRN
  Administered 2022-12-19: 85 mL via INTRAVENOUS

## 2022-12-21 ENCOUNTER — Ambulatory Visit
Admission: RE | Admit: 2022-12-21 | Discharge: 2022-12-21 | Disposition: A | Discharge status: Home / Self Care | Payer: BC Managed Care – PPO | Attending: Gastroenterology | Admitting: Gastroenterology

## 2022-12-21 ENCOUNTER — Encounter
Admission: RE | Disposition: A | Discharge status: Home / Self Care | Payer: Self-pay | Source: Home / Self Care | Attending: Gastroenterology

## 2022-12-21 ENCOUNTER — Encounter: Payer: Self-pay | Admitting: Gastroenterology

## 2022-12-21 ENCOUNTER — Other Ambulatory Visit: Payer: Self-pay

## 2022-12-21 ENCOUNTER — Ambulatory Visit: Payer: BC Managed Care – PPO | Admitting: Anesthesiology

## 2022-12-21 DIAGNOSIS — E669 Obesity, unspecified: Secondary | ICD-10-CM | POA: Insufficient documentation

## 2022-12-21 DIAGNOSIS — F419 Anxiety disorder, unspecified: Secondary | ICD-10-CM | POA: Diagnosis not present

## 2022-12-21 DIAGNOSIS — F32A Depression, unspecified: Secondary | ICD-10-CM | POA: Diagnosis not present

## 2022-12-21 DIAGNOSIS — K449 Diaphragmatic hernia without obstruction or gangrene: Secondary | ICD-10-CM | POA: Insufficient documentation

## 2022-12-21 DIAGNOSIS — M419 Scoliosis, unspecified: Secondary | ICD-10-CM | POA: Diagnosis not present

## 2022-12-21 DIAGNOSIS — Z6835 Body mass index (BMI) 35.0-35.9, adult: Secondary | ICD-10-CM | POA: Diagnosis not present

## 2022-12-21 DIAGNOSIS — R12 Heartburn: Secondary | ICD-10-CM | POA: Diagnosis not present

## 2022-12-21 DIAGNOSIS — K219 Gastro-esophageal reflux disease without esophagitis: Secondary | ICD-10-CM | POA: Insufficient documentation

## 2022-12-21 DIAGNOSIS — Z79899 Other long term (current) drug therapy: Secondary | ICD-10-CM | POA: Insufficient documentation

## 2022-12-21 HISTORY — DX: Scoliosis, unspecified: M41.9

## 2022-12-21 HISTORY — DX: Gastro-esophageal reflux disease without esophagitis: K21.9

## 2022-12-21 HISTORY — DX: Cardiac murmur, unspecified: R01.1

## 2022-12-21 HISTORY — PX: ESOPHAGOGASTRODUODENOSCOPY (EGD) WITH PROPOFOL: SHX5813

## 2022-12-21 LAB — POCT PREGNANCY, URINE: Preg Test, Ur: NEGATIVE

## 2022-12-21 SURGERY — ESOPHAGOGASTRODUODENOSCOPY (EGD) WITH PROPOFOL
Anesthesia: General | Site: Mouth

## 2022-12-21 MED ORDER — PROPOFOL 10 MG/ML IV BOLUS
INTRAVENOUS | Status: DC | PRN
  Administered 2022-12-21: 50 mg via INTRAVENOUS
  Administered 2022-12-21: 100 mg via INTRAVENOUS
  Administered 2022-12-21: 50 mg via INTRAVENOUS

## 2022-12-21 MED ORDER — LACTATED RINGERS IV SOLN: INTRAVENOUS | Status: DC

## 2022-12-21 MED ORDER — STERILE WATER FOR IRRIGATION IR SOLN
Status: DC | PRN
  Administered 2022-12-21: 150 mL

## 2022-12-21 MED ORDER — GLYCOPYRROLATE 0.2 MG/ML IJ SOLN
INTRAMUSCULAR | Status: DC | PRN
  Administered 2022-12-21: .2 mg via INTRAVENOUS

## 2022-12-21 MED ORDER — LIDOCAINE HCL (CARDIAC) PF 100 MG/5ML IV SOSY
PREFILLED_SYRINGE | INTRAVENOUS | Status: DC | PRN
  Administered 2022-12-21: 100 mg via INTRAVENOUS

## 2022-12-21 MED ORDER — SODIUM CHLORIDE 0.9 % IV SOLN: INTRAVENOUS | Status: DC

## 2022-12-21 SURGICAL SUPPLY — 32 items

## 2022-12-21 NOTE — H&P (Signed)
Midge Minium, MD [facility] [address], [address] [place], [place] [address] Phone:[phone] Fax : [phone]  Primary Care Physician:  Dortha Kern, MD Primary Gastroenterologist:  Dr. Servando Snare  Pre-Procedure History & Physical: HPI:  Sharon Kelly is a 22 y.o. female is here for an endoscopy.   Past Medical History:  Diagnosis Date   Anxiety    Depression    GERD (gastroesophageal reflux disease)    Heart murmur    at birth.  Resolved   Scoliosis     Past Surgical History:  Procedure Laterality Date   CHOLECYSTECTOMY N/A 01/11/2015   Procedure: LAPAROSCOPIC CHOLECYSTECTOMY WITH INTRAOPERATIVE CHOLANGIOGRAM;  Surgeon: Earline Mayotte, MD;  Location: [facility] ORS;  Service: General;  Laterality: N/A;    Prior to Admission medications   Medication Sig Start Date End Date Taking? Authorizing Provider  APPLE CIDER VINEGAR PO Take by mouth daily.   Yes [provider]  busPIRone (BUSPAR) 15 MG tablet Take 15 mg by mouth 2 (two) times daily. 10/08/22  Yes [provider]  Cholecalciferol (VITAMIN D3) 50 MCG (2000 UT) CAPS Take by mouth.   Yes [provider]  DULoxetine (CYMBALTA) 60 MG capsule Take 1 capsule (60 mg total) by mouth 2 (two) times daily. 10/26/20 12/13/22 Yes Pucilowski, Olgierd A, MD  esomeprazole (NEXIUM) 40 MG capsule Take 40 mg by mouth daily. 09/05/22  Yes [provider]  loratadine (CLARITIN) 10 MG tablet Take 10 mg by mouth daily.   Yes [provider]  Norethindrone-Ethinyl Estradiol-Fe Biphas (LO LOESTRIN FE) 1 MG-10 MCG / 10 MCG tablet Take 1 tablet by mouth daily.   Yes [provider]  traZODone (DESYREL) 100 MG tablet Take 1 tablet (100 mg total) by mouth at bedtime as needed for sleep. 10/26/20 12/13/22 Yes Pucilowski, Roosvelt Maser, MD  VALERIAN ROOT PO Take by mouth daily as needed.   Yes [provider]    Allergies as of 12/12/2022   (No Known Allergies)    Family History  Problem Relation Age  of Onset   Diabetes Mother    Cholelithiasis Mother    Depression Mother    Cholelithiasis Paternal Grandfather    Depression Sister    Anxiety disorder Sister    Anxiety disorder Maternal Aunt    Depression Maternal Aunt     Social History   Socioeconomic History   Marital status: Single    Spouse name: Not on file   Number of children: Not on file   Years of education: Not on file   Highest education level: Not on file  Occupational History   Occupation: fulltrime student  Tobacco Use   Smoking status: Never   Smokeless tobacco: Never  Vaping Use   Vaping Use: Never used  Substance and Sexual Activity   Alcohol use: No    Alcohol/week: 0.0 standard drinks of alcohol   Drug use: No   Sexual activity: Not Currently    Partners: Male    Birth control/protection: Pill  Other Topics Concern   Not on file  Social History Narrative   [employer] Full time Student will transfer to [employer] in 2021 [employer]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   Social Determinants of Health   Financial Resource Strain: Low Risk  (06/08/2019)   Overall Financial Resource Strain (CARDIA)    Difficulty of Paying Living Expenses: Not hard at all  Food Insecurity: No Food Insecurity (06/08/2019)   Hunger Vital Sign    Worried About Programme researcher, broadcasting/film/video in  the Last Year: Never true    Ran Out of Food in the Last Year: Never true  Transportation Needs: No Transportation Needs (06/08/2019)   PRAPARE - Administrator, Civil Service (Medical): No    Lack of Transportation (Non-Medical): No  Physical Activity: Insufficiently Active (06/08/2019)   Exercise Vital Sign    Days of Exercise per Week: 2 days    Minutes of Exercise per Session: 30 min  Stress: No Stress Concern Present (06/08/2019)   [employer] of Occupational Health - Occupational Stress Questionnaire    Feeling of Stress : Not at all  Social Connections: Moderately Isolated (06/08/2019)   Social Connection and Isolation Panel [NHANES]     Frequency of Communication with Friends and Family: More than three times a week    Frequency of Social Gatherings with Friends and Family: Never    Attends Religious Services: More than 4 times per year    Active Member of [employer] or Organizations: No    Attends Banker Meetings: Never    Marital Status: Never married  Intimate Partner Violence: Not At Risk (06/08/2019)   Humiliation, Afraid, Rape, and Kick questionnaire    Fear of Current or Ex-Partner: No    Emotionally Abused: No    Physically Abused: No    Sexually Abused: No    Review of Systems: See HPI, otherwise negative ROS  Physical Exam: Ht 5' (1.524 m)   Wt 83.5 kg   LMP 11/21/2022 (Approximate)   BMI 35.94 kg/m  General:   Alert,  pleasant and cooperative in NAD Head:  Normocephalic and atraumatic. Neck:  Supple; no masses or thyromegaly. Lungs:  Clear throughout to auscultation.    Heart:  Regular rate and rhythm. Abdomen:  Soft, nontender and nondistended. Normal bowel sounds, without guarding, and without rebound.   Neurologic:  Alert and  oriented x4;  grossly normal neurologically.  Impression/Plan: Sharon Kelly is here for an endoscopy to be performed for GERD  Risks, benefits, limitations, and alternatives regarding  endoscopy have been reviewed with the patient.  Questions have been answered.  All parties agreeable.   Midge Minium, MD  12/21/2022, 9:43 AM

## 2022-12-21 NOTE — Op Note (Signed)
[facility] Gastroenterology Patient Name: Sharon Kelly Procedure Date: 12/21/2022 10:37 AM MRN: [id number] Account #: [id number] Date of Birth: 2001-03-16 Admit Type: Outpatient Age: 22 Room: [facility] OR ROOM 01 Gender: Female Note Status: Finalized Instrument Name: [id number] Procedure:             Upper GI endoscopy Indications:           Heartburn Providers:             Midge Minium MD, MD Referring MD:          Dortha Kern (Referring MD) Medicines:             Propofol per Anesthesia Complications:         No immediate complications. Procedure:             Pre-Anesthesia Assessment:                        - Prior to the procedure, a History and Physical was                         performed, and patient medications and allergies were                         reviewed. The patient's tolerance of previous                         anesthesia was also reviewed. The risks and benefits                         of the procedure and the sedation options and risks                         were discussed with the patient. All questions were                         answered, and informed consent was obtained. Prior                         Anticoagulants: The patient has taken no anticoagulant                         or antiplatelet agents. ASA Grade Assessment: II - A                         patient with mild systemic disease. After reviewing                         the risks and benefits, the patient was deemed in                         satisfactory condition to undergo the procedure.                        After obtaining informed consent, the endoscope was                         passed under direct vision. Throughout the procedure,  the patient's blood pressure, pulse, and oxygen                         saturations were monitored continuously. The was                         introduced through the mouth, and advanced to the                          second part of duodenum. The upper GI endoscopy was                         accomplished without difficulty. The patient tolerated                         the procedure well. Findings:      A small hiatal hernia was present.      The stomach was normal.      The examined duodenum was normal. Impression:            - Small hiatal hernia.                        - Normal stomach.                        - Normal examined duodenum.                        - No specimens collected. Recommendation:        - Discharge patient to home.                        - Resume previous diet.                        - Continue present medications. Procedure Code(s):     --- Professional ---                        [phone], Esophagogastroduodenoscopy, flexible,                         transoral; diagnostic, including collection of                         specimen(s) by brushing or washing, when performed                         (separate procedure) Diagnosis Code(s):     --- Professional ---                        R12, Heartburn CPT copyright 2022 American Medical Association. All rights reserved. The codes documented in this report are preliminary and upon coder review may  be revised to meet current compliance requirements. Midge Minium MD, MD 12/21/2022 10:50:39 AM This report has been signed electronically. Number of Addenda: 0 Note Initiated On: 12/21/2022 10:37 AM Total Procedure Duration: 0 hours 3 minutes 8 seconds  Estimated Blood Loss:  Estimated blood loss: none.      [facility]

## 2022-12-21 NOTE — Transfer of Care (Signed)
Immediate Anesthesia Transfer of Care Note  Patient: Sharon Kelly  Procedure(s) Performed: ESOPHAGOGASTRODUODENOSCOPY (EGD) WITH PROPOFOL (Mouth)  Patient Location: PACU  Anesthesia Type: General  Level of Consciousness: awake, alert  and patient cooperative  Airway and Oxygen Therapy: Patient Spontanous Breathing and Patient connected to supplemental oxygen  Post-op Assessment: Post-op Vital signs reviewed, Patient's Cardiovascular Status Stable, Respiratory Function Stable, Patent Airway and No signs of Nausea or vomiting  Post-op Vital Signs: Reviewed and stable  Complications: No notable events documented.

## 2022-12-21 NOTE — Anesthesia Postprocedure Evaluation (Signed)
Anesthesia Post Note  Patient: Sharon Kelly  Procedure(s) Performed: ESOPHAGOGASTRODUODENOSCOPY (EGD) WITH PROPOFOL (Mouth)  Patient location during evaluation: PACU Anesthesia Type: General Level of consciousness: awake and alert Pain management: pain level controlled Vital Signs Assessment: post-procedure vital signs reviewed and stable Respiratory status: spontaneous breathing, nonlabored ventilation and respiratory function stable Cardiovascular status: blood pressure returned to baseline and stable Postop Assessment: no apparent nausea or vomiting Anesthetic complications: no   No notable events documented.   Last Vitals:  Vitals:   12/21/22 1053 12/21/22 1101  BP: 113/69 118/83  Pulse: 95 95  Resp: 18 20  Temp: 36.6 C   SpO2: 96% 100%    Last Pain:  Vitals:   12/21/22 1101  TempSrc:   PainSc: 0-No pain                 Foye Deer

## 2022-12-24 ENCOUNTER — Encounter: Payer: Self-pay | Admitting: Gastroenterology

## 2022-12-26 ENCOUNTER — Ambulatory Visit
Admission: RE | Admit: 2022-12-26 | Discharge: 2022-12-26 | Disposition: A | Discharge status: Home / Self Care | Payer: BC Managed Care – PPO | Source: Ambulatory Visit | Attending: Surgery | Admitting: Surgery

## 2022-12-26 DIAGNOSIS — K219 Gastro-esophageal reflux disease without esophagitis: Secondary | ICD-10-CM | POA: Insufficient documentation

## 2023-01-09 ENCOUNTER — Encounter: Payer: Self-pay | Admitting: Surgery

## 2023-01-09 ENCOUNTER — Telehealth: Payer: Self-pay | Admitting: Surgery

## 2023-01-09 ENCOUNTER — Ambulatory Visit (INDEPENDENT_AMBULATORY_CARE_PROVIDER_SITE_OTHER): Payer: BC Managed Care – PPO | Admitting: Surgery

## 2023-01-09 VITALS — BP 129/86 | HR 104 | Temp 98.4°F | Ht 60.0 in | Wt 185.2 lb

## 2023-01-09 DIAGNOSIS — K219 Gastro-esophageal reflux disease without esophagitis: Secondary | ICD-10-CM | POA: Diagnosis not present

## 2023-01-09 NOTE — Telephone Encounter (Signed)
Patient has been advised of Pre-Admission date/time, and Surgery date at [facility].  Surgery Date: 02/07/23 Preadmission Testing Date: 01/30/23 (phone 8a-1p)  Patient has been made aware to call [phone], between 1-3:00pm the day before surgery, to find out what time to arrive for surgery.

## 2023-01-09 NOTE — Patient Instructions (Addendum)
Our surgery scheduler Britta Mccreedy will call you within 24-48 hours to get you scheduled. If you have not heard from her after 48 hours, please call our office. Have the blue sheet available when she calls to write down important information.  If you have any concerns or questions, please feel free to call our office.   Laparoscopic Nissen Fundoplication  Laparoscopic Nissen fundoplication is a surgery to relieve heartburn and other problems caused by fluid from your stomach (gastric fluids) flowing up into your esophagus. The esophagus is the part of the body that moves food from the mouth to the stomach. Normally, the muscle that sits between the stomach and the esophagus (lower esophageal sphincter, LES) keeps stomach fluids in the stomach. In some people, the LES does not work properly, and stomach fluids flow up into the esophagus (reflux). This can happen when part of the stomach bulges through the LES (hiatal hernia). The backward flow of stomach fluids can cause a type of severe and long-lasting heartburn that is called gastroesophageal reflux disease (GERD). You may need this surgery if other treatments for GERD have not helped. In this procedure, the upper part of your stomach is wrapped around the lower end of your esophagus and stitched together (sutured). This tightens the connection between your esophagus and stomach to prevent stomach acid reflux. Tell a health care provider about: Any allergies you have. All medicines you are taking, including vitamins, herbs, eye drops, creams, and over-the-counter medicines. Any problems you or family members have had with anesthetic medicines. Any blood disorders you have. Any surgeries you have had. Any medical conditions you have. Whether you are pregnant or may be pregnant. What are the risks? Generally, this is a safe procedure. However, problems may occur, including: Infection. Bleeding. Damage to other structures or organs. This can include  damage to the lung, causing a collapsed lung. Trouble swallowing (dysphagia). Blood clots. Allergic reactions to medicines. What happens before the procedure? Staying hydrated Follow instructions from your health care provider about hydration, which may include: Up to two hours before the procedure - you may continue to drink clear liquids, such as water, clear fruit juice, black coffee and plain tea. Eating and drinking restrictions Follow instructions from your health care provider about eating and drinking, which may include: 8 hours before the procedure - stop eating heavy meals or foods, such as meat, fried foods, or fatty foods. 6 hours before the procedure - stop eating light meals or foods, such as toast or cereal. 6 hours before the procedure - stop drinking milk or drinks that contain milk. 2 hours before the procedure - stop drinking clear liquids. Medicines Ask your health care provider about: Changing or stopping your regular medicines. This is especially important if you are taking diabetes medicines or blood thinners. Taking medicines such as aspirin and ibuprofen. These medicines can thin your blood. Do not take these medicines unless your health care provider tells you to take them. Taking over-the-counter medicines, vitamins, herbs, and supplements. Tests Your health care provider will do tests to plan the procedure. This may include: An exam using a flexible scope passed down your esophagus into your stomach (endoscopy). Imaging studies. General instructions Plan to have a responsible adult take you home from the hospital or clinic. Ask your health care provider: How your surgery site will be marked. What steps will be taken to help prevent infection. These steps may include: Removing hair at the surgery site. Washing skin with a germ-killing soap. Taking  antibiotic medicine. Do not use any products that contain nicotine or tobacco for at least 4 weeks before the  procedure. These products include cigarettes, chewing tobacco, and vaping devices, such as e-cigarettes. If you need help quitting, ask your health care provider. What happens during the procedure? An IV will be inserted into one of your veins. You will be given medicine in your IV to help you relax (sedative) just before the procedure and a medicine to make you fall asleep (general anesthetic). You may have a tube placed through your nose into your stomach to drain stomach acid during the procedure (nasogastric tube). The surgeon will make a small incision in your abdomen and insert a tube through the incision. Your abdomen will be filled with a gas. This helps the surgeon see your organs better, and it makes more space to work. The surgeon will insert a thin, lighted tube (laparoscope) through the small incision. This allows your surgeon to see into your abdomen. The surgeon will make several other small incisions in your abdomen to insert the other instruments that are needed during the procedure. Another instrument (dilator) will be passed through your mouth and down your esophagus into the upper part of your stomach. The dilator will prevent your LES from being closed too tightly during surgery. The upper part of your stomach will be wrapped around the lower part of your esophagus and will be stitched into place. This will strengthen the lower esophageal sphincter and prevent reflux. If you have a hiatal hernia, it will be repaired. The gas will be released from your abdomen. All instruments will be removed, and the incisions will be closed with stitches (sutures). A bandage (dressing) will be placed on your skin over the incisions. The procedure may vary among health care providers and hospitals. What happens after the procedure? Your blood pressure, heart rate, breathing rate, and blood oxygen level will be monitored until you leave the hospital or clinic. You will be given pain medicine as  needed. Your IV will be kept in until you are able to drink fluids. You will be encouraged to get up and walk around as soon as possible. Summary Laparoscopic Nissen fundoplication is a surgery to relieve heartburn and other problems caused by gastric fluids flowing up into your esophagus. You may need this surgery if other treatments for GERD have not helped. Follow instructions from your health care provider about eating and drinking before the procedure. Your surgeon will use a thin, lighted tube (laparoscope) that is inserted through a small incision, allowing the surgeon to see into your abdomen.  We have spoken today about repairing your Hiatal Hernia. Your surgery will be scheduled at [facility] with Dr. Everlene Farrier.  Plan to be in the hospital for 1-2 days if the minimally invasive surgery is completed without having to make a bigger incision. If the bigger incision is made, you will most likely need to be in the hospital 4-6 days. You will be on a soft diet and need to recover for 2 weeks following your surgery prior to doing any of your normal activities. At the 2 week mark, we will see you in the office and if you are doing ok we will advance your diet and activity level as you tolerate.  Please see your Blue (Pre-care) Sheet for more information regarding your surgery. Our surgery scheduler will call you to verify surgery date and to go over information  You will need to arrange to be out of work for  approximately 1-2 weeks and then you may return with a lifting restriction for 4 more weeks. If you have FMLA or Disability paperwork that needs to be filled out, please have your company fax your paperwork to [phone] or you may drop this by either office. This paperwork will be filled out within 3 days after your surgery has been completed.  Please call our office with any questions or concerns that you have regarding your surgery and recovery.    Note: Dairy products, such as milk, ice cream  and pudding, may cause diarrhea in some people just after surgery. You may need to avoid milk products. If so, substitute them with lactose-free beverages, such as soy, rice, Lactaid or almond milks.  Nissen Soft Diet Food Category Foods to Choose Foods to Avoid  Beverages Milk, such as, whole, 2%, 1%, non-fat, or skim, soy, rice, almond  Caffeinated and decaf tea and coffee  Powdered drink mixes (in moderation)  Non-citrus juices (apple, grape, cranberry or blends of these)  Fruit nectars  Nutritional drinks including Boost, Ensure, Carnation Instant Breakfast Chocolate milk, cocoa or other chocolate-flavored drinks  Carbonated drinks  Alcohol  Citrus juices like orange, grapefruit, lemon and lime  Breads Crackers (saltine, butter, soda, graham, Goldfish and Cheese Nips)   Untoasted bread, bagels, Kaiser and hard rolls, English muffins  Crackers with nuts, seeds, fresh or dried fruit, coconut, or highly seasoned, such as garlic or onion-flavored  Sweet rolls, coffee cake or doughnuts  Cereals Well cooked cereals, such as oatmeal (plain or flavored)  Cold cereal (Cornflakes, Rice Krispies, Cheerios, Special K plain, Rice Chex and puffed rice) Very coarse cereal, such as bran, shredded wheat  Any cereal with fresh or dried fruit, coconut, seeds or nuts  Desserts Eat in moderation and do not eat desserts or sweets by themselves. Plain cakes, cookies and cream-filled pies  Vanilla and butterscotch pudding or custard  Ice cream, ice milk, frozen yogurt and sherbet  Gelatin made from allowed foods  Fruit ices and popsicles Desserts containing chocolate, coconut, nuts, seeds, fresh or dried fruit, peppermint or spearmint  Eggs  Poached, hard boiled or scrambled Fried eggs and highly seasoned eggs (deviled eggs)  Fats Eat in moderation. Butter and margarine  Mayonnaise and vegetable oils  Mildly seasoned cream sauces and gravies  Plain cream cheese  Sour cream Highly seasoned  salad dressings, cream sauces and gravies  Bacon, bacon fat, ham fat, lard and salt pork  Fried foods  Nuts  Fruits Fruit juice  Any canned or cooked fruit except those listed in the AVOID column ALL fresh fruits, such as citrus, apples, and pineapple  Canned pineapple  Dried fruits, such as raisins, berries  Fruits with seeds, such as berries, kiwi and figs  Meat, Fish, Poultry, and [employer] may be ground, minced or chopped to ease swallowing and digestion  Tender, well cooked and moist cuts of beef, chicken, [place] and pork  Veal and lamb  Flaky, cooked fish  Canned tuna  Cottage and ricotta cheeses  Mild cheese, such as American, brick, mozzarella and baby Swiss  Creamy peanut butter  Plain custard or blended fruit yogurt  Moist casseroles, such as macaroni & cheese, tuna noodle  Grilled or toasted cheese sandwich Tough meats with a lot of gristle  Fried, highly seasoned, smoked and fatty meat, fish or poultry, such as frankfurters, luncheon meats, sausage, bacon, spare ribs, beef brisket, sardines, anchovies, duck and goose  Chili and other entrees made with pepper  or chili pepper  Shellfish  Strongly flavored cheeses, such as sharp cheese, extra sharp cheddar, cheese containing peppers or other seasonings  Crunchy peanut butter  Any yogurt with nuts, seeds, coconut, strawberries or raspberries  Potatoes and Starches Peeled, mashed or boiled white or sweet potatoes  Oven-baked potatoes without skin  Well cooked white rice, enriched noodles, barley, spaghetti, macaroni and other pastas Fried potatoes, potato skins and potato chips  Hard and soft taco shells  Fried, brown or wild rice  Soups Mildly flavored meat stocks  Cream soups made from allowed foods Highly seasoned soups and tomato based soups, cream soups made with gas producing vegetables, such as broccoli, cauliflower, onion, etc.  Sweets and Snacks Use in moderation and do not eat large amounts of sweets by  themselves. Syrup, honey, jelly and seedless jam  Plain hard candies and plain candies made with allowed ingredients  Molasses  Marshmallows  Other candy made from allowed ingredients  Thin pretzels Jam, marmalade and preserves  Chocolate in any form  Any candy containing nuts, coconut, seeds, peppermint, spearmint or dried or fresh fruit  Popcorn, potato chips, tortilla chips  Soft or hard thick pretzels, such as sourdough  Vegetables Well cooked soft vegetables without seeds or skins, such as asparagus tips, beets, carrots, green and wax beans, chopped spinach, tender canned baby peas, squash and pumpkin Raw vegetables, tomatoes, tomato juice, tomato sauce and V-8 juice(tomato based products can irritate the stomach)  Gas producing vegetables, such as broccoli, Brussel sprouts, cabbage, cauliflower, onions, corn, cucumber, green peppers, rutabagas, turnips, radishes and sauerkraut  Dried beans, peas and lentils  Miscellaneous Salt and spices in moderation  Mustard and vinegar in moderation Fried or highly seasoned foods  Coconut and seeds  Pickles and olives  Chili sauces, ketchup, barbecue sauce, horseradish, black pepper, chili powder and onion and garlic seasonings  Any other strongly flavored seasoning, condiment, spice or herb not tolerated  Any food not tolerated

## 2023-01-11 NOTE — Progress Notes (Signed)
Outpatient Surgical Follow Up  01/11/2023  Sharon Kelly is an 22 y.o. female.   Chief Complaint  Patient presents with   Follow-up    Acid reflux    HPI: Sharon Kelly is a 22 y.o. female seen in cF/U, continues to endorse reflux..   She states that sometimes she gets regurgitation in the middle of the night and has to clean her bed sheets.  She also had some cough and vomiting that night.  She has been taking Nexium with some control of the reflux symptoms.  Patient does not have dysphagia.  She has followed behavioral modification for reflux and continues to have episode of regurgitation. He did have a prior history of laparoscopic cholecystectomy by Dr. Lemar Livings 8 years ago or so He is able to perform more than 4 METS of activity without any shortness of breath or chest pain.  She is accompanied by her mother today. Does have some general anxiety disorder and takes buspirone Years ago she had CBC and CBC that were completely normal.  She denies any easy bruising. She Is a Administrator, arts in high school. She did complete an EGD, swallow as well as a CT scan of the abdomen pelvis that I have personally reviewed, no evidence of sliding hiatal hernia and there is also evidence of GERD.  No other acute intra-abdominal abnormalities.  Past Medical History:  Diagnosis Date   Anxiety    Depression    GERD (gastroesophageal reflux disease)    Heart murmur    at birth.  Resolved   Scoliosis     Past Surgical History:  Procedure Laterality Date   CHOLECYSTECTOMY N/A 01/11/2015   Procedure: LAPAROSCOPIC CHOLECYSTECTOMY WITH INTRAOPERATIVE CHOLANGIOGRAM;  Surgeon: Earline Mayotte, MD;  Location: [facility];  Service: General;  Laterality: N/A;   ESOPHAGOGASTRODUODENOSCOPY (EGD) WITH PROPOFOL N/A 12/21/2022   Procedure: ESOPHAGOGASTRODUODENOSCOPY (EGD) WITH PROPOFOL;  Surgeon: Midge Minium, MD;  Location: [facility];  Service: Endoscopy;  Laterality: N/A;    Family History  Problem  Relation Age of Onset   Diabetes Mother    Cholelithiasis Mother    Depression Mother    Cholelithiasis Paternal Grandfather    Depression Sister    Anxiety disorder Sister    Anxiety disorder Maternal Aunt    Depression Maternal Aunt     Social History:  reports that she has never smoked. She has never used smokeless tobacco. She reports that she does not drink alcohol and does not use drugs.  Allergies: No Known Allergies  Medications reviewed.    ROS Full ROS performed and is otherwise negative other than what is stated in HPI   BP 129/86   Pulse (!) 104   Temp 98.4 F (36.9 C) (Oral)   Ht 5' (1.524 m)   Wt 185 lb 3.2 oz (84 kg)   LMP 11/21/2022 (Approximate) Comment: upreg negative  SpO2 98%   BMI 36.17 kg/m   Physical Exam  CONSTITUTIONAL: NAD. EYES: Pupils are equal, round, and reactive to light, Sclera are non-icteric. EARS, NOSE, MOUTH AND THROAT: The oropharynx is clear. The oral mucosa is pink and moist. Hearing is intact to voice. LYMPH NODES:  Lymph nodes in the neck are normal. RESPIRATORY:  Lungs are clear. There is normal respiratory effort, with equal breath sounds bilaterally, and without pathologic use of accessory muscles. CARDIOVASCULAR: Heart is regular without murmurs, gallops, or rubs. GI: The abdomen is  soft, nontender, and nondistended. There are no palpable masses. There is  no hepatosplenomegaly. There are normal bowel sounds  GU: Rectal deferred.   MUSCULOSKELETAL: Normal muscle strength and tone. No cyanosis or edema.   SKIN: Turgor is good and there are no pathologic skin lesions or ulcers. NEUROLOGIC: Motor and sensation is grossly normal. Cranial nerves are grossly intact. PSYCH:  Oriented to person, place and time. Affect is normal.   Assessment/Plan: 22 year old female with significant reflux and regurgitation symptoms.  Patient is interested in antireflux procedure given her quality of life issues associated to the reflux.  Do  think that she will be a good candidate for robotic approach however I do encourage her to continue to make efforts to get to an optimal and better BMI.  We also talked about different options to control reflux to include medication lifestyle modification as well as surgical therapy.  We did talk about Nissen fundoplication.  The risk,  the benefits and the possible complications to include bloating diarrhea, bowel and esophageal injuries and adhering strictly to postoperative diet.  She still seems interested. Spent 40 minutes in this encounter including personally reviewing imaging studies, coordinating her care, placing orders and performing appropriate mentation   Sterling Big, MD [facility] General Surgeon

## 2023-01-11 NOTE — H&P (View-Only) (Signed)
Outpatient Surgical Follow Up  01/11/2023  Sharon Kelly is an 22 y.o. female.   Chief Complaint  Patient presents with   Follow-up    Acid reflux    HPI: Sharon Kelly is a 22 y.o. female seen in cF/U, continues to endorse reflux..   She states that sometimes she gets regurgitation in the middle of the night and has to clean her bed sheets.  She also had some cough and vomiting that night.  She has been taking Nexium with some control of the reflux symptoms.  Patient does not have dysphagia.  She has followed behavioral modification for reflux and continues to have episode of regurgitation. He did have a prior history of laparoscopic cholecystectomy by Dr. Byrnett 8 years ago or so He is able to perform more than 4 METS of activity without any shortness of breath or chest pain.  She is accompanied by her mother today. Does have some general anxiety disorder and takes buspirone Years ago she had CBC and CBC that were completely normal.  She denies any easy bruising. She Is a science teacher in high school. She did complete an EGD, swallow as well as a CT scan of the abdomen pelvis that I have personally reviewed, no evidence of sliding hiatal hernia and there is also evidence of GERD.  No other acute intra-abdominal abnormalities.  Past Medical History:  Diagnosis Date   Anxiety    Depression    GERD (gastroesophageal reflux disease)    Heart murmur    at birth.  Resolved   Scoliosis     Past Surgical History:  Procedure Laterality Date   CHOLECYSTECTOMY N/A 01/11/2015   Procedure: LAPAROSCOPIC CHOLECYSTECTOMY WITH INTRAOPERATIVE CHOLANGIOGRAM;  Surgeon: Jeffrey W Byrnett, MD;  Location: [facility];  Service: General;  Laterality: N/A;   ESOPHAGOGASTRODUODENOSCOPY (EGD) WITH PROPOFOL N/A 12/21/2022   Procedure: ESOPHAGOGASTRODUODENOSCOPY (EGD) WITH PROPOFOL;  Surgeon: Wohl, Darren, MD;  Location: [facility];  Service: Endoscopy;  Laterality: N/A;    Family History  Problem  Relation Age of Onset   Diabetes Mother    Cholelithiasis Mother    Depression Mother    Cholelithiasis Paternal Grandfather    Depression Sister    Anxiety disorder Sister    Anxiety disorder Maternal Aunt    Depression Maternal Aunt     Social History:  reports that she has never smoked. She has never used smokeless tobacco. She reports that she does not drink alcohol and does not use drugs.  Allergies: No Known Allergies  Medications reviewed.    ROS Full ROS performed and is otherwise negative other than what is stated in HPI   BP 129/86   Pulse (!) 104   Temp 98.4 F (36.9 C) (Oral)   Ht 5' (1.524 m)   Wt 185 lb 3.2 oz (84 kg)   LMP 11/21/2022 (Approximate) Comment: upreg negative  SpO2 98%   BMI 36.17 kg/m   Physical Exam  CONSTITUTIONAL: NAD. EYES: Pupils are equal, round, and reactive to light, Sclera are non-icteric. EARS, NOSE, MOUTH AND THROAT: The oropharynx is clear. The oral mucosa is pink and moist. Hearing is intact to voice. LYMPH NODES:  Lymph nodes in the neck are normal. RESPIRATORY:  Lungs are clear. There is normal respiratory effort, with equal breath sounds bilaterally, and without pathologic use of accessory muscles. CARDIOVASCULAR: Heart is regular without murmurs, gallops, or rubs. GI: The abdomen is  soft, nontender, and nondistended. There are no palpable masses. There is   no hepatosplenomegaly. There are normal bowel sounds  GU: Rectal deferred.   MUSCULOSKELETAL: Normal muscle strength and tone. No cyanosis or edema.   SKIN: Turgor is good and there are no pathologic skin lesions or ulcers. NEUROLOGIC: Motor and sensation is grossly normal. Cranial nerves are grossly intact. PSYCH:  Oriented to person, place and time. Affect is normal.   Assessment/Plan: 22-year-old female with significant reflux and regurgitation symptoms.  Patient is interested in antireflux procedure given her quality of life issues associated to the reflux.  Do  think that she will be a good candidate for robotic approach however I do encourage her to continue to make efforts to get to an optimal and better BMI.  We also talked about different options to control reflux to include medication lifestyle modification as well as surgical therapy.  We did talk about Nissen fundoplication.  The risk,  the benefits and the possible complications to include bloating diarrhea, bowel and esophageal injuries and adhering strictly to postoperative diet.  She still seems interested. Spent 40 minutes in this encounter including personally reviewing imaging studies, coordinating her care, placing orders and performing appropriate mentation   Daiki Dicostanzo, MD FACS General Surgeon 

## 2023-01-30 ENCOUNTER — Encounter
Admission: RE | Admit: 2023-01-30 | Discharge: 2023-01-30 | Disposition: A | Discharge status: Home / Self Care | Payer: BC Managed Care – PPO | Source: Ambulatory Visit | Attending: Surgery | Admitting: Surgery

## 2023-01-30 VITALS — Ht 60.0 in | Wt 185.0 lb

## 2023-01-30 DIAGNOSIS — E669 Obesity, unspecified: Secondary | ICD-10-CM

## 2023-01-30 DIAGNOSIS — Z01810 Encounter for preprocedural cardiovascular examination: Secondary | ICD-10-CM

## 2023-01-30 DIAGNOSIS — Z7902 Long term (current) use of antithrombotics/antiplatelets: Secondary | ICD-10-CM

## 2023-01-30 DIAGNOSIS — Z01812 Encounter for preprocedural laboratory examination: Secondary | ICD-10-CM

## 2023-01-30 HISTORY — DX: Obesity, unspecified: E66.9

## 2023-01-30 NOTE — Patient Instructions (Addendum)
Your procedure is scheduled on: Thursday, June 20 Report to the Registration Desk on the 1st floor of the [facility] To find out your arrival time, please call [phone] between 1PM - 3PM on: Wednesday, June 19 If your arrival time is 6:00 am, do not arrive before that time as the [facility] entrance doors do not open until 6:00 am.  REMEMBER: Instructions that are not followed completely may result in serious medical risk, up to and including death; or upon the discretion of your surgeon and anesthesiologist your surgery may need to be rescheduled.  Do not eat food after midnight the night before surgery.  No gum chewing or hard candies.  You may however, drink CLEAR liquids up to 2 hours before you are scheduled to arrive for your surgery. Do not drink anything within 2 hours of your scheduled arrival time.  Clear liquids include: - water  - apple juice without pulp - gatorade (not RED colors) - black coffee or tea (Do NOT add milk or creamers to the coffee or tea) Do NOT drink anything that is not on this list.  One week prior to surgery: starting June 13 Stop Anti-inflammatories (NSAIDS) such as Advil, Aleve, Ibuprofen, Motrin, Naproxen, Naprosyn and Aspirin based products such as Excedrin, Goody's Powder, BC Powder. Stop ANY OVER THE COUNTER supplements until after surgery. Stop apple cider vinegar, vitamin D, valerian root. You may however, continue to take Tylenol if needed for pain up until the day of surgery.  Continue taking all prescribed medications  TAKE ONLY THESE MEDICATIONS THE MORNING OF SURGERY WITH A SIP OF WATER:  Buspirone Duloxetine (Cymbalta) Esomeprazole (Nexium) - (take one the night before and one on the morning of surgery - helps to prevent nausea after surgery.)  No Alcohol for 24 hours before or after surgery.  No Smoking including e-cigarettes for 24 hours before surgery.  No chewable tobacco products for at least 6 hours before surgery.   No nicotine patches on the day of surgery.  Do not use any "recreational" drugs for at least a week (preferably 2 weeks) before your surgery.  Please be advised that the combination of cocaine and anesthesia may have negative outcomes, up to and including death. If you test positive for cocaine, your surgery will be cancelled.  On the morning of surgery brush your teeth with toothpaste and water, you may rinse your mouth with mouthwash if you wish. Do not swallow any toothpaste or mouthwash.  Use CHG Soap as directed on instruction sheet.  Do not wear jewelry, make-up, hairpins, clips or nail polish.  Do not wear lotions, powders, or perfumes.   Do not shave body hair from the neck down 48 hours before surgery.  Contact lenses, hearing aids and dentures may not be worn into surgery.  Do not bring valuables to the hospital. [facility] is not responsible for any missing/lost belongings or valuables.   Notify your doctor if there is any change in your medical condition (cold, fever, infection).  Wear comfortable clothing (specific to your surgery type) to the hospital.  After surgery, you can help prevent lung complications by doing breathing exercises.  Take deep breaths and cough every 1-2 hours. Your doctor may order a device called an Incentive Spirometer to help you take deep breaths. When coughing or sneezing, hold a pillow firmly against your incision with both hands. This is called "splinting." Doing this helps protect your incision. It also decreases belly discomfort.  If you are being  admitted to the hospital overnight, leave your suitcase in the car. After surgery it may be brought to your room.  In case of increased patient census, it may be necessary for you, the patient, to continue your postoperative care in the Same Day Surgery department.  If you are being discharged the day of surgery, you will not be allowed to drive home. You will need a responsible individual to  drive you home and stay with you for 24 hours after surgery.   If you are taking public transportation, you will need to have a responsible individual with you.  Please call the Pre-admissions Testing Dept. at [phone] if you have any questions about these instructions.  Surgery Visitation Policy:  Patients having surgery or a procedure may have two visitors.  Children under the age of 59 must have an adult with them who is not the patient.  Inpatient Visitation:    Visiting hours are 7 a.m. to 8 p.m. Up to four visitors are allowed at one time in a patient room. The visitors may rotate out with other people during the day.  One visitor age 2 or older may stay with the patient overnight and must be in the room by 8 p.m.     Preparing for Surgery with CHLORHEXIDINE GLUCONATE (CHG) Soap  Chlorhexidine Gluconate (CHG) Soap  o An antiseptic cleaner that kills germs and bonds with the skin to continue killing germs even after washing  o Used for showering the night before surgery and morning of surgery  Before surgery, you can play an important role by reducing the number of germs on your skin.  CHG (Chlorhexidine gluconate) soap is an antiseptic cleanser which kills germs and bonds with the skin to continue killing germs even after washing.  Please do not use if you have an allergy to CHG or antibacterial soaps. If your skin becomes reddened/irritated stop using the CHG.  1. Shower the NIGHT BEFORE SURGERY and the MORNING OF SURGERY with CHG soap.  2. If you choose to wash your hair, wash your hair first as usual with your normal shampoo.  3. After shampooing, rinse your hair and body thoroughly to remove the shampoo.  4. Use CHG as you would any other liquid soap. You can apply CHG directly to the skin and wash gently with a scrungie or a clean washcloth.  5. Apply the CHG soap to your body only from the neck down. Do not use on open wounds or open sores. Avoid contact  with your eyes, ears, mouth, and genitals (private parts). Wash face and genitals (private parts) with your normal soap.  6. Wash thoroughly, paying special attention to the area where your surgery will be performed.  7. Thoroughly rinse your body with warm water.  8. Do not shower/wash with your normal soap after using and rinsing off the CHG soap.  9. Pat yourself dry with a clean towel.  10. Wear clean pajamas to bed the night before surgery.  12. Place clean sheets on your bed the night of your first shower and do not sleep with pets.  13. Shower again with the CHG soap on the day of surgery prior to arriving at the hospital.  14. Do not apply any deodorants/lotions/powders.  15. Please wear clean clothes to the hospital.

## 2023-02-01 ENCOUNTER — Encounter
Admission: RE | Admit: 2023-02-01 | Discharge: 2023-02-01 | Disposition: A | Discharge status: Home / Self Care | Payer: BC Managed Care – PPO | Source: Ambulatory Visit | Attending: Surgery | Admitting: Surgery

## 2023-02-01 DIAGNOSIS — Z01812 Encounter for preprocedural laboratory examination: Secondary | ICD-10-CM | POA: Diagnosis present

## 2023-02-01 DIAGNOSIS — E669 Obesity, unspecified: Secondary | ICD-10-CM | POA: Insufficient documentation

## 2023-02-01 LAB — CBC
HCT: 40.1 % (ref 36.0–46.0)
Hemoglobin: 12.6 g/dL (ref 12.0–15.0)
MCH: 26.5 pg (ref 26.0–34.0)
MCHC: 31.4 g/dL (ref 30.0–36.0)
MCV: 84.4 fL (ref 80.0–100.0)
Platelets: 304 10*3/uL (ref 150–400)
RBC: 4.75 MIL/uL (ref 3.87–5.11)
RDW: 13.5 % (ref 11.5–15.5)
WBC: 7.9 10*3/uL (ref 4.0–10.5)
nRBC: 0 % (ref 0.0–0.2)

## 2023-02-01 LAB — BASIC METABOLIC PANEL
Anion gap: 10 (ref 5–15)
BUN: 7 mg/dL (ref 6–20)
CO2: 24 mmol/L (ref 22–32)
Calcium: 8.9 mg/dL (ref 8.9–10.3)
Chloride: 103 mmol/L (ref 98–111)
Creatinine, Ser: 0.72 mg/dL (ref 0.44–1.00)
GFR, Estimated: >60 mL/min (ref >60)
Glucose, Bld: 112 mg/dL — ABNORMAL HIGH (ref 70–99)
Potassium: 4.1 mmol/L (ref 3.5–5.1)
Sodium: 137 mmol/L (ref 135–145)

## 2023-02-06 MED ORDER — CELECOXIB 200 MG PO CAPS
200.0000 mg | ORAL_CAPSULE | ORAL | Status: AC
  Administered 2023-02-07: 200 mg via ORAL

## 2023-02-06 MED ORDER — CHLORHEXIDINE GLUCONATE CLOTH 2 % EX PADS: 6.0000 | MEDICATED_PAD | Freq: Once | CUTANEOUS | Status: DC

## 2023-02-06 MED ORDER — CEFAZOLIN SODIUM-DEXTROSE 2-4 GM/100ML-% IV SOLN
2.0000 g | INTRAVENOUS | Status: AC
  Administered 2023-02-07: 2 g via INTRAVENOUS

## 2023-02-06 MED ORDER — ACETAMINOPHEN 500 MG PO TABS
1000.0000 mg | ORAL_TABLET | ORAL | Status: AC
  Administered 2023-02-07: 1000 mg via ORAL

## 2023-02-06 MED ORDER — GABAPENTIN 300 MG PO CAPS
300.0000 mg | ORAL_CAPSULE | ORAL | Status: AC
  Administered 2023-02-07: 300 mg via ORAL

## 2023-02-06 MED ORDER — CHLORHEXIDINE GLUCONATE CLOTH 2 % EX PADS
6.0000 | MEDICATED_PAD | Freq: Once | CUTANEOUS | Status: AC
  Administered 2023-02-07: 6 via TOPICAL

## 2023-02-06 MED ORDER — LACTATED RINGERS IV SOLN: INTRAVENOUS | Status: DC

## 2023-02-06 MED ORDER — CHLORHEXIDINE GLUCONATE 0.12 % MT SOLN
15.0000 mL | Freq: Once | OROMUCOSAL | Status: AC
  Administered 2023-02-07: 15 mL via OROMUCOSAL

## 2023-02-06 MED ORDER — ORAL CARE MOUTH RINSE: 15.0000 mL | Freq: Once | OROMUCOSAL | Status: AC

## 2023-02-07 ENCOUNTER — Observation Stay
Admission: RE | Admit: 2023-02-07 | Discharge: 2023-02-08 | Disposition: A | Discharge status: Home / Self Care | Payer: BC Managed Care – PPO | Attending: Surgery | Admitting: Surgery

## 2023-02-07 ENCOUNTER — Other Ambulatory Visit: Payer: Self-pay

## 2023-02-07 ENCOUNTER — Ambulatory Visit: Payer: BC Managed Care – PPO | Admitting: Anesthesiology

## 2023-02-07 ENCOUNTER — Encounter: Payer: Self-pay | Admitting: Surgery

## 2023-02-07 ENCOUNTER — Encounter
Admission: RE | Disposition: A | Discharge status: Home / Self Care | Payer: Self-pay | Source: Home / Self Care | Attending: Surgery

## 2023-02-07 ENCOUNTER — Ambulatory Visit: Payer: BC Managed Care – PPO | Admitting: Urgent Care

## 2023-02-07 DIAGNOSIS — K449 Diaphragmatic hernia without obstruction or gangrene: Secondary | ICD-10-CM | POA: Diagnosis present

## 2023-02-07 DIAGNOSIS — K219 Gastro-esophageal reflux disease without esophagitis: Secondary | ICD-10-CM | POA: Diagnosis not present

## 2023-02-07 DIAGNOSIS — Z8719 Personal history of other diseases of the digestive system: Secondary | ICD-10-CM

## 2023-02-07 DIAGNOSIS — Z01812 Encounter for preprocedural laboratory examination: Secondary | ICD-10-CM

## 2023-02-07 HISTORY — PX: XI ROBOTIC ASSISTED PARAESOPHAGEAL HERNIA REPAIR: SHX6871

## 2023-02-07 HISTORY — PX: INSERTION OF MESH: SHX5868

## 2023-02-07 LAB — CREATININE, SERUM
Creatinine, Ser: 0.72 mg/dL (ref 0.44–1.00)
GFR, Estimated: >60 mL/min (ref >60)

## 2023-02-07 LAB — CBC
HCT: 38.1 % (ref 36.0–46.0)
Hemoglobin: 12.1 g/dL (ref 12.0–15.0)
MCH: 26.5 pg (ref 26.0–34.0)
MCHC: 31.8 g/dL (ref 30.0–36.0)
MCV: 83.4 fL (ref 80.0–100.0)
Platelets: 291 10*3/uL (ref 150–400)
RBC: 4.57 MIL/uL (ref 3.87–5.11)
RDW: 13.3 % (ref 11.5–15.5)
WBC: 14.9 10*3/uL — ABNORMAL HIGH (ref 4.0–10.5)
nRBC: 0 % (ref 0.0–0.2)

## 2023-02-07 LAB — POCT PREGNANCY, URINE: Preg Test, Ur: NEGATIVE

## 2023-02-07 SURGERY — REPAIR, HERNIA, PARAESOPHAGEAL, ROBOT-ASSISTED
Anesthesia: General

## 2023-02-07 MED ORDER — DEXMEDETOMIDINE HCL IN NACL 80 MCG/20ML IV SOLN
INTRAVENOUS | Status: AC
  Filled 2023-02-07: qty 20

## 2023-02-07 MED ORDER — BUPIVACAINE-EPINEPHRINE 0.25% -1:200000 IJ SOLN
INTRAMUSCULAR | Status: DC | PRN
  Administered 2023-02-07: 30 mL

## 2023-02-07 MED ORDER — ONDANSETRON HCL 4 MG/2ML IJ SOLN
INTRAMUSCULAR | Status: AC
  Filled 2023-02-07: qty 2

## 2023-02-07 MED ORDER — ALBUMIN HUMAN 5 % IV SOLN: INTRAVENOUS | Status: DC | PRN

## 2023-02-07 MED ORDER — SUGAMMADEX SODIUM 200 MG/2ML IV SOLN
INTRAVENOUS | Status: DC | PRN
  Administered 2023-02-07 (×2): 200 mg via INTRAVENOUS

## 2023-02-07 MED ORDER — METHOCARBAMOL 1000 MG/10ML IJ SOLN: 500.0000 mg | Freq: Three times a day (TID) | INTRAVENOUS | Status: DC | PRN

## 2023-02-07 MED ORDER — METOPROLOL TARTRATE 5 MG/5ML IV SOLN
INTRAVENOUS | Status: DC | PRN
  Administered 2023-02-07: 2 mg via INTRAVENOUS
  Administered 2023-02-07: 1 mg via INTRAVENOUS
  Administered 2023-02-07: 2 mg via INTRAVENOUS

## 2023-02-07 MED ORDER — PHENYLEPHRINE HCL-NACL 20-0.9 MG/250ML-% IV SOLN
INTRAVENOUS | Status: DC | PRN
  Administered 2023-02-07: 50 ug/min via INTRAVENOUS

## 2023-02-07 MED ORDER — DIPHENHYDRAMINE HCL 50 MG/ML IJ SOLN: 12.5000 mg | Freq: Four times a day (QID) | INTRAMUSCULAR | Status: DC | PRN

## 2023-02-07 MED ORDER — KETOROLAC TROMETHAMINE 30 MG/ML IJ SOLN
INTRAMUSCULAR | Status: AC
  Filled 2023-02-07: qty 1

## 2023-02-07 MED ORDER — HYDRALAZINE HCL 20 MG/ML IJ SOLN: 10.0000 mg | INTRAMUSCULAR | Status: DC | PRN

## 2023-02-07 MED ORDER — EPHEDRINE SULFATE (PRESSORS) 50 MG/ML IJ SOLN
INTRAMUSCULAR | Status: DC | PRN
  Administered 2023-02-07 (×3): 5 mg via INTRAVENOUS

## 2023-02-07 MED ORDER — BUPIVACAINE HCL (PF) 0.25 % IJ SOLN
INTRAMUSCULAR | Status: AC
  Filled 2023-02-07: qty 30

## 2023-02-07 MED ORDER — PHENYLEPHRINE HCL (PRESSORS) 10 MG/ML IV SOLN
INTRAVENOUS | Status: DC | PRN
  Administered 2023-02-07: 120 ug via INTRAVENOUS
  Administered 2023-02-07: 160 ug via INTRAVENOUS

## 2023-02-07 MED ORDER — SCOPOLAMINE 1 MG/3DAYS TD PT72
1.0000 | MEDICATED_PATCH | TRANSDERMAL | Status: DC
  Administered 2023-02-07: 1.5 mg via TRANSDERMAL

## 2023-02-07 MED ORDER — PHENYLEPHRINE HCL-NACL 20-0.9 MG/250ML-% IV SOLN
INTRAVENOUS | Status: AC
  Filled 2023-02-07: qty 250

## 2023-02-07 MED ORDER — HYDROMORPHONE HCL 1 MG/ML IJ SOLN
INTRAMUSCULAR | Status: AC
  Filled 2023-02-07: qty 1

## 2023-02-07 MED ORDER — EPINEPHRINE PF 1 MG/ML IJ SOLN
INTRAMUSCULAR | Status: AC
  Filled 2023-02-07: qty 1

## 2023-02-07 MED ORDER — HYDROMORPHONE HCL 1 MG/ML IJ SOLN
INTRAMUSCULAR | Status: DC | PRN
  Administered 2023-02-07: .5 mg via INTRAVENOUS

## 2023-02-07 MED ORDER — ONDANSETRON HCL 4 MG/2ML IJ SOLN
INTRAMUSCULAR | Status: DC | PRN
  Administered 2023-02-07: 4 mg via INTRAVENOUS

## 2023-02-07 MED ORDER — LACTATED RINGERS IV SOLN: INTRAVENOUS | Status: DC | PRN

## 2023-02-07 MED ORDER — SODIUM CHLORIDE 0.9 % IV SOLN: INTRAVENOUS | Status: DC

## 2023-02-07 MED ORDER — FENTANYL CITRATE (PF) 100 MCG/2ML IJ SOLN
INTRAMUSCULAR | Status: DC | PRN
  Administered 2023-02-07: 100 ug via INTRAVENOUS

## 2023-02-07 MED ORDER — FENTANYL CITRATE (PF) 100 MCG/2ML IJ SOLN
INTRAMUSCULAR | Status: AC
  Filled 2023-02-07: qty 2

## 2023-02-07 MED ORDER — CELECOXIB 200 MG PO CAPS
ORAL_CAPSULE | ORAL | Status: AC
  Filled 2023-02-07: qty 1

## 2023-02-07 MED ORDER — METOPROLOL TARTRATE 5 MG/5ML IV SOLN
INTRAVENOUS | Status: AC
  Filled 2023-02-07: qty 5

## 2023-02-07 MED ORDER — CHLORHEXIDINE GLUCONATE 0.12 % MT SOLN
OROMUCOSAL | Status: AC
  Filled 2023-02-07: qty 15

## 2023-02-07 MED ORDER — ROCURONIUM BROMIDE 10 MG/ML (PF) SYRINGE
PREFILLED_SYRINGE | INTRAVENOUS | Status: AC
  Filled 2023-02-07: qty 10

## 2023-02-07 MED ORDER — BUSPIRONE HCL 10 MG PO TABS
15.0000 mg | ORAL_TABLET | Freq: Two times a day (BID) | ORAL | Status: DC
  Administered 2023-02-07 – 2023-02-08 (×2): 15 mg via ORAL
  Filled 2023-02-07 (×2): qty 2

## 2023-02-07 MED ORDER — OXYCODONE HCL 5 MG PO TABS
5.0000 mg | ORAL_TABLET | ORAL | Status: DC | PRN
  Administered 2023-02-08: 5 mg via ORAL
  Filled 2023-02-07: qty 1

## 2023-02-07 MED ORDER — ONDANSETRON HCL 4 MG/2ML IJ SOLN: 4.0000 mg | Freq: Once | INTRAMUSCULAR | Status: DC | PRN

## 2023-02-07 MED ORDER — LIDOCAINE HCL (CARDIAC) PF 100 MG/5ML IV SOSY
PREFILLED_SYRINGE | INTRAVENOUS | Status: DC | PRN
  Administered 2023-02-07: 100 mg via INTRAVENOUS

## 2023-02-07 MED ORDER — ONDANSETRON HCL 4 MG/2ML IJ SOLN: 4.0000 mg | Freq: Four times a day (QID) | INTRAMUSCULAR | Status: DC | PRN

## 2023-02-07 MED ORDER — MIDAZOLAM HCL 2 MG/2ML IJ SOLN
INTRAMUSCULAR | Status: AC
  Filled 2023-02-07: qty 2

## 2023-02-07 MED ORDER — PROCHLORPERAZINE MALEATE 10 MG PO TABS: 10.0000 mg | ORAL_TABLET | Freq: Four times a day (QID) | ORAL | Status: DC | PRN

## 2023-02-07 MED ORDER — VISTASEAL 10 ML SINGLE DOSE KIT
PACK | CUTANEOUS | Status: AC
  Filled 2023-02-07: qty 10

## 2023-02-07 MED ORDER — LORATADINE 10 MG PO TABS
10.0000 mg | ORAL_TABLET | Freq: Every day | ORAL | Status: DC
  Administered 2023-02-08: 10 mg via ORAL
  Filled 2023-02-07: qty 1

## 2023-02-07 MED ORDER — DEXAMETHASONE SODIUM PHOSPHATE 10 MG/ML IJ SOLN
INTRAMUSCULAR | Status: AC
  Filled 2023-02-07: qty 1

## 2023-02-07 MED ORDER — DIPHENHYDRAMINE HCL 12.5 MG/5ML PO ELIX: 12.5000 mg | ORAL_SOLUTION | Freq: Four times a day (QID) | ORAL | Status: DC | PRN

## 2023-02-07 MED ORDER — SCOPOLAMINE 1 MG/3DAYS TD PT72
MEDICATED_PATCH | TRANSDERMAL | Status: AC
  Filled 2023-02-07: qty 1

## 2023-02-07 MED ORDER — CEFAZOLIN SODIUM-DEXTROSE 1-4 GM/50ML-% IV SOLN
1.0000 g | Freq: Three times a day (TID) | INTRAVENOUS | Status: AC
  Administered 2023-02-07 – 2023-02-08 (×3): 1 g via INTRAVENOUS
  Filled 2023-02-07 (×3): qty 50

## 2023-02-07 MED ORDER — PHENYLEPHRINE 80 MCG/ML (10ML) SYRINGE FOR IV PUSH (FOR BLOOD PRESSURE SUPPORT)
PREFILLED_SYRINGE | INTRAVENOUS | Status: AC
  Filled 2023-02-07: qty 10

## 2023-02-07 MED ORDER — DEXAMETHASONE SODIUM PHOSPHATE 10 MG/ML IJ SOLN
INTRAMUSCULAR | Status: DC | PRN
  Administered 2023-02-07: 6 mg via INTRAVENOUS

## 2023-02-07 MED ORDER — ROCURONIUM BROMIDE 100 MG/10ML IV SOLN
INTRAVENOUS | Status: DC | PRN
  Administered 2023-02-07: 70 mg via INTRAVENOUS
  Administered 2023-02-07: 20 mg via INTRAVENOUS

## 2023-02-07 MED ORDER — EPHEDRINE 5 MG/ML INJ
INTRAVENOUS | Status: AC
  Filled 2023-02-07: qty 5

## 2023-02-07 MED ORDER — METHOCARBAMOL 500 MG PO TABS: 500.0000 mg | ORAL_TABLET | Freq: Three times a day (TID) | ORAL | Status: DC | PRN

## 2023-02-07 MED ORDER — KETAMINE HCL 50 MG/5ML IJ SOSY
PREFILLED_SYRINGE | INTRAMUSCULAR | Status: AC
  Filled 2023-02-07: qty 5

## 2023-02-07 MED ORDER — PROPOFOL 10 MG/ML IV BOLUS
INTRAVENOUS | Status: AC
  Filled 2023-02-07: qty 20

## 2023-02-07 MED ORDER — OXYCODONE HCL 5 MG/5ML PO SOLN: 5.0000 mg | Freq: Once | ORAL | Status: DC | PRN

## 2023-02-07 MED ORDER — GABAPENTIN 300 MG PO CAPS
ORAL_CAPSULE | ORAL | Status: AC
  Filled 2023-02-07: qty 1

## 2023-02-07 MED ORDER — FENTANYL CITRATE (PF) 100 MCG/2ML IJ SOLN
25.0000 ug | INTRAMUSCULAR | Status: DC | PRN
  Administered 2023-02-07: 25 ug via INTRAVENOUS
  Administered 2023-02-07 (×2): 50 ug via INTRAVENOUS

## 2023-02-07 MED ORDER — OXYCODONE HCL 5 MG PO TABS: 5.0000 mg | ORAL_TABLET | Freq: Once | ORAL | Status: DC | PRN

## 2023-02-07 MED ORDER — MIDAZOLAM HCL 2 MG/2ML IJ SOLN
INTRAMUSCULAR | Status: DC | PRN
  Administered 2023-02-07: 2 mg via INTRAVENOUS

## 2023-02-07 MED ORDER — MORPHINE SULFATE (PF) 4 MG/ML IV SOLN
4.0000 mg | INTRAVENOUS | Status: DC | PRN
  Administered 2023-02-07: 4 mg via INTRAVENOUS
  Filled 2023-02-07: qty 1

## 2023-02-07 MED ORDER — DULOXETINE HCL 30 MG PO CPEP
60.0000 mg | ORAL_CAPSULE | Freq: Two times a day (BID) | ORAL | Status: DC
  Administered 2023-02-07 – 2023-02-08 (×2): 60 mg via ORAL
  Filled 2023-02-07 (×2): qty 2

## 2023-02-07 MED ORDER — ACETAMINOPHEN 10 MG/ML IV SOLN: 1000.0000 mg | Freq: Once | INTRAVENOUS | Status: DC | PRN

## 2023-02-07 MED ORDER — LIDOCAINE HCL (PF) 2 % IJ SOLN
INTRAMUSCULAR | Status: AC
  Filled 2023-02-07: qty 5

## 2023-02-07 MED ORDER — PROPOFOL 10 MG/ML IV BOLUS
INTRAVENOUS | Status: DC | PRN
  Administered 2023-02-07: 150 mg via INTRAVENOUS

## 2023-02-07 MED ORDER — VISTASEAL 10 ML SINGLE DOSE KIT
PACK | CUTANEOUS | Status: DC | PRN
  Administered 2023-02-07: 10 mL via TOPICAL

## 2023-02-07 MED ORDER — ACETAMINOPHEN 500 MG PO TABS
ORAL_TABLET | ORAL | Status: AC
  Filled 2023-02-07: qty 2

## 2023-02-07 MED ORDER — BUPIVACAINE LIPOSOME 1.3 % IJ SUSP
INTRAMUSCULAR | Status: DC | PRN
  Administered 2023-02-07: 20 mL

## 2023-02-07 MED ORDER — CEFAZOLIN SODIUM-DEXTROSE 2-4 GM/100ML-% IV SOLN
INTRAVENOUS | Status: AC
  Filled 2023-02-07: qty 100

## 2023-02-07 MED ORDER — ACETAMINOPHEN 500 MG PO TABS
1000.0000 mg | ORAL_TABLET | Freq: Four times a day (QID) | ORAL | Status: DC
  Administered 2023-02-07 – 2023-02-08 (×3): 1000 mg via ORAL
  Filled 2023-02-07 (×3): qty 2

## 2023-02-07 MED ORDER — DEXMEDETOMIDINE HCL IN NACL 80 MCG/20ML IV SOLN
INTRAVENOUS | Status: DC | PRN
  Administered 2023-02-07: 8 ug via INTRAVENOUS
  Administered 2023-02-07: 12 ug via INTRAVENOUS

## 2023-02-07 MED ORDER — DIPHENHYDRAMINE HCL 50 MG/ML IJ SOLN
INTRAMUSCULAR | Status: AC
  Filled 2023-02-07: qty 1

## 2023-02-07 MED ORDER — KETOROLAC TROMETHAMINE 30 MG/ML IJ SOLN
30.0000 mg | Freq: Four times a day (QID) | INTRAMUSCULAR | Status: DC
  Administered 2023-02-07 – 2023-02-08 (×4): 30 mg via INTRAVENOUS
  Filled 2023-02-07 (×3): qty 1

## 2023-02-07 MED ORDER — PROCHLORPERAZINE EDISYLATE 10 MG/2ML IJ SOLN: 5.0000 mg | Freq: Four times a day (QID) | INTRAMUSCULAR | Status: DC | PRN

## 2023-02-07 MED ORDER — ONDANSETRON 4 MG PO TBDP: 4.0000 mg | ORAL_TABLET | Freq: Four times a day (QID) | ORAL | Status: DC | PRN

## 2023-02-07 MED ORDER — VASOPRESSIN 20 UNIT/ML IV SOLN
INTRAVENOUS | Status: DC | PRN
  Administered 2023-02-07: 1 [IU] via INTRAVENOUS
  Administered 2023-02-07 (×3): 2 [IU] via INTRAVENOUS
  Administered 2023-02-07: 1 [IU] via INTRAVENOUS

## 2023-02-07 MED ORDER — ENOXAPARIN SODIUM 40 MG/0.4ML IJ SOSY
40.0000 mg | PREFILLED_SYRINGE | INTRAMUSCULAR | Status: DC
  Administered 2023-02-08: 40 mg via SUBCUTANEOUS
  Filled 2023-02-07: qty 0.4

## 2023-02-07 MED ORDER — KETAMINE HCL 10 MG/ML IJ SOLN
INTRAMUSCULAR | Status: DC | PRN
  Administered 2023-02-07 (×2): 20 mg via INTRAVENOUS
  Administered 2023-02-07: 10 mg via INTRAVENOUS

## 2023-02-07 SURGICAL SUPPLY — 56 items
ADH SKN CLS APL DERMABOND .7 (GAUZE/BANDAGES/DRESSINGS) ×4
APL LAPSCP 35 DL APL RGD (MISCELLANEOUS) ×2
APPLICATOR VISTASEAL 35 (MISCELLANEOUS) IMPLANT
CANNULA REDUCER 12-8 DVNC XI (CANNULA) ×2 IMPLANT
CLIP LIGATING HEM O LOK PURPLE (MISCELLANEOUS) IMPLANT
DERMABOND ADVANCED .7 DNX12 (GAUZE/BANDAGES/DRESSINGS) ×2 IMPLANT
DRAPE ARM DVNC X/XI (DISPOSABLE) ×8 IMPLANT
DRAPE COLUMN DVNC XI (DISPOSABLE) ×2 IMPLANT
ELECT CAUTERY BLADE 6.4 (BLADE) ×2 IMPLANT
ELECT REM PT RETURN 9FT ADLT (ELECTROSURGICAL) ×2
ELECTRODE REM PT RTRN 9FT ADLT (ELECTROSURGICAL) ×2 IMPLANT
FORCEPS BPLR R/ABLATION 8 DVNC (INSTRUMENTS) ×2 IMPLANT
GLOVE BIO SURGEON STRL SZ7 (GLOVE) ×6 IMPLANT
GOWN STRL REUS W/ TWL LRG LVL3 (GOWN DISPOSABLE) ×8 IMPLANT
GOWN STRL REUS W/TWL LRG LVL3 (GOWN DISPOSABLE) ×6
GRASPER LAPSCPC 5X45 DSP (INSTRUMENTS) ×2 IMPLANT
GRASPER TIP-UP FEN DVNC XI (INSTRUMENTS) ×2 IMPLANT
IRRIGATION STRYKERFLOW (MISCELLANEOUS) IMPLANT
IRRIGATOR STRYKERFLOW (MISCELLANEOUS) ×2
IV NS 1000ML (IV SOLUTION) ×2
IV NS 1000ML BAXH (IV SOLUTION) IMPLANT
KIT PINK PAD W/HEAD ARE REST (MISCELLANEOUS) ×2
KIT PINK PAD W/HEAD ARM REST (MISCELLANEOUS) ×2 IMPLANT
LABEL OR SOLS (LABEL) ×2 IMPLANT
MANIFOLD NEPTUNE II (INSTRUMENTS) ×2 IMPLANT
MESH BIO-A 7X10 SYN MAT (Mesh General) IMPLANT
NDL DRIVE SUT CUT DVNC (INSTRUMENTS) ×2 IMPLANT
NDL HYPO 22X1.5 SAFETY MO (MISCELLANEOUS) ×2 IMPLANT
NEEDLE DRIVE SUT CUT DVNC (INSTRUMENTS) ×2 IMPLANT
NEEDLE HYPO 22X1.5 SAFETY MO (MISCELLANEOUS) ×2 IMPLANT
OBTURATOR OPTICAL STND 8 DVNC (TROCAR) ×2
OBTURATOR OPTICALSTD 8 DVNC (TROCAR) ×2 IMPLANT
PACK LAP CHOLECYSTECTOMY (MISCELLANEOUS) ×2 IMPLANT
PENCIL SMOKE EVACUATOR (MISCELLANEOUS) ×2 IMPLANT
SEAL UNIV 5-12 XI (MISCELLANEOUS) ×6 IMPLANT
SEALER VESSEL EXT DVNC XI (MISCELLANEOUS) ×2 IMPLANT
SOL ELECTROSURG ANTI STICK (MISCELLANEOUS) ×2
SOLUTION ELECTROSURG ANTI STCK (MISCELLANEOUS) ×2 IMPLANT
SPIKE FLUID TRANSFER (MISCELLANEOUS) ×2 IMPLANT
SPONGE T-LAP 18X18 ~~LOC~~+RFID (SPONGE) ×2 IMPLANT
SUT MNCRL 4-0 (SUTURE) ×4
SUT MNCRL 4-0 27XMFL (SUTURE) ×4
SUT SILK 2 0 SH (SUTURE) ×4 IMPLANT
SUT VIC AB 3-0 SH 27 (SUTURE)
SUT VIC AB 3-0 SH 27X BRD (SUTURE) IMPLANT
SUT VICRYL 0 UR6 27IN ABS (SUTURE) ×4 IMPLANT
SUT VLOC 90 S/L VL9 GS22 (SUTURE) ×2 IMPLANT
SUTURE MNCRL 4-0 27XMF (SUTURE) ×2 IMPLANT
SYR 30ML LL (SYRINGE) ×2 IMPLANT
SYS BAG RETRIEVAL 10MM (BASKET)
SYSTEM BAG RETRIEVAL 10MM (BASKET) IMPLANT
TRAP FLUID SMOKE EVACUATOR (MISCELLANEOUS) ×2 IMPLANT
TRAY FOLEY SLVR 16FR LF STAT (SET/KITS/TRAYS/PACK) ×2 IMPLANT
TROCAR Z-THREAD FIOS 5X100MM (TROCAR) ×2 IMPLANT
TUBING EVAC SMOKE HEATED PNEUM (TUBING) ×2 IMPLANT
WATER STERILE IRR 500ML POUR (IV SOLUTION) ×2 IMPLANT

## 2023-02-07 NOTE — Transfer of Care (Signed)
Immediate Anesthesia Transfer of Care Note  Patient: Sharon Kelly  Procedure(s) Performed: XI ROBOTIC ASSISTED PARAESOPHAGEAL HERNIA REPAIR, RNFA to assist INSERTION OF MESH  Patient Location: PACU  Anesthesia Type:General  Level of Consciousness: drowsy and patient cooperative  Airway & Oxygen Therapy: Patient Spontanous Breathing and Patient connected to face mask oxygen  Post-op Assessment: Report given to RN and Post -op Vital signs reviewed and stable  Post vital signs: Reviewed and stable  Last Vitals:  Vitals Value Taken Time  BP 127/77 02/07/23 1018  Temp    Pulse 117 02/07/23 1022  Resp 18 02/07/23 1022  SpO2 98 % 02/07/23 1022  Vitals shown include unvalidated device data.  Last Pain:  Vitals:   02/07/23 0621  TempSrc: Temporal  PainSc: 0-No pain         Complications: No notable events documented.

## 2023-02-07 NOTE — TOC CM/SW Note (Signed)
Transition of Care [facility]) - Inpatient Brief Assessment   Patient Details  Name: Sharon Kelly MRN: [id number] Date of Birth: Feb 12, 2001  [facility] CM/SW Contact:    Chapman Fitch, RN Phone Number: 02/07/2023, 3:10 PM   Clinical Narrative:    Transition of Care Asessment: Insurance and Status: Insurance coverage has been reviewed Patient has primary care physician: Yes     Prior/Current Home Services: No current home services Social Determinants of Health Reivew: SDOH reviewed no interventions necessary Readmission risk has been reviewed: Yes Transition of care needs: no transition of care needs at this time

## 2023-02-07 NOTE — Discharge Instructions (Signed)
In addition to included general post-operative instructions,  Diet: Recommend following Nissen diet restrictions for a minimum of 4 weeks. Hand out given regarding this.   Activity: No heavy lifting >20 pounds (children, pets, laundry, garbage) or strenuous activity for 4 weeks, but light activity and walking are encouraged. Do not drive or drink alcohol if taking narcotic pain medications or having pain that might distract from driving.  Wound care: 2 days after surgery (06/22), you may shower/get incision wet with soapy water and pat dry (do not rub incisions), but no baths or submerging incision underwater until follow-up.   Medications: Resume all home medications. For mild to moderate pain: acetaminophen (Tylenol) or ibuprofen/naproxen (if no kidney disease). Combining Tylenol with alcohol can substantially increase your risk of causing liver disease. Narcotic pain medications, if prescribed, can be used for severe pain, though may cause nausea, constipation, and drowsiness. Do not combine Tylenol and Percocet (or similar) within a 6 hour period as Percocet (and similar) contain(s) Tylenol. If you do not need the narcotic pain medication, you do not need to fill the prescription.  Call office [phone] / [phone]) at any time if any questions, worsening pain, fevers/chills, bleeding, drainage from incision site, or other concerns.

## 2023-02-07 NOTE — Anesthesia Procedure Notes (Signed)
Procedure Name: Intubation Date/Time: 02/07/2023 7:40 AM  Performed by: Lynden Oxford, CRNAPre-anesthesia Checklist: Patient identified, Emergency Drugs available, Suction available and Patient being monitored Patient Re-evaluated:Patient Re-evaluated prior to induction Oxygen Delivery Method: Circle system utilized Preoxygenation: Pre-oxygenation with 100% oxygen Induction Type: IV induction and Rapid sequence Laryngoscope Size: McGraph and 3 Grade View: Grade I Tube type: Oral Number of attempts: 1 Airway Equipment and Method: Stylet and Video-laryngoscopy Placement Confirmation: ETT inserted through vocal cords under direct vision, positive ETCO2 and breath sounds checked- equal and bilateral Secured at: 21 cm Tube secured with: Tape Dental Injury: Teeth and Oropharynx as per pre-operative assessment

## 2023-02-07 NOTE — Interval H&P Note (Signed)
History and Physical Interval Note:  02/07/2023 7:19 AM  Sharon Kelly  has presented today for surgery, with the diagnosis of paraesophageal hernia.  The various methods of treatment have been discussed with the patient and family. After consideration of risks, benefits and other options for treatment, the patient has consented to  Procedure(s): XI ROBOTIC ASSISTED PARAESOPHAGEAL HERNIA REPAIR, RNFA to assist (N/A) as a surgical intervention.  The patient's history has been reviewed, patient examined, no change in status, stable for surgery.  I have reviewed the patient's chart and labs.  Questions were answered to the patient's satisfaction.     Sharon Kelly

## 2023-02-07 NOTE — Op Note (Signed)
Robotic assisted laparoscopic repair of  paraesophageal  hernia with Bio-A Mesh and Nissen fundoplication  Pre-operative Diagnosis: GERD, hiatal hernia  Post-operative Diagnosis: same  Procedure:  Robotic assisted laparoscopic repair of  paraesophageal  hernia with Bio-A Mesh and Nissen fundoplication  Surgeon: Sterling Big, MD FACS  Assistant: Carolynne Edouard RNFA . Required due to the complexity of the case the need for exposure and lack of first assist.  Anesthesia: Gen. 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  Estimated Blood Loss: 10cc       Specimens: sac           Complications: none   Procedure Details  The patient was seen again in the Holding Room. The benefits, complications, treatment options, and expected outcomes were discussed with the patient. The risks of bleeding, infection, recurrence of symptoms, failure to resolve symptoms,  esophageal damage, Dysphagia, bowel injury, any of which could require further surgery were reviewed with the patient. The likelihood of improving the patient's symptoms with return to their baseline status is good.  The patient and/or family concurred with the proposed plan, giving informed consent.  The patient was taken to Operating Room, identified  and the procedure verified.  A Time Out was held and the above information confirmed.  Prior to the induction of general anesthesia, antibiotic prophylaxis was administered. VTE prophylaxis was in place. General endotracheal anesthesia was then administered and tolerated well. After the induction, the abdomen was prepped with Chloraprep and draped in the sterile fashion. The patient was positioned in  the supine position.  Cut down technique was used to enter the abdominal cavity and a Hasson trochar was placed after two vicryl stitches were anchored to the fascia. Pneumoperitoneum was then created with CO2 and initially tolerated well without any adverse changes in the patient's vital signs.  Three 8-mm ports were placed under direct vision. All skin incisions  were infiltrated with a local anesthetic agent before making the incision and placing the trocars. An additional 5 mm regular laparoscopic port was placed to assist with retraction and exposure.   The patient was positioned  in reverse Trendelenburg, robot was brought to the surgical field and docked in the standard fashion.  We made sure all the instrumentation was kept indirect view at all times and that there were no collision between the arms. I scrubbed out and went to the console.  I used a robotic arm to retract the liver, the vessel sealer on my right hand and a forced bipolar grasper on my left hand.  There is along the extra 5 mm port allow me ample exposure and the ability to perform meticulous dissection Shortly after starting the case her BP dropped, we decreased the insufflation pressure and let anesthesia catch up, Once hemodynamics improved we continued the case.  We Started dividing the lesser omentum via the pars flaccida.  We Were able to dissect the lesser curvature of the stomach and  dissected the fundus free from the right and left crus.  We circumferentially dissected the GE junction.  The hernia sac was also completely reduced and we were able to bring the stomach into the intra-abdominal position.  Attention then was turned to the greater curvature where  the short gastrics were divided with sealer device.  We were able to identify the left crus and again were able to make sure there was a good circumferential dissection and that the hernia sac was completely excised.  We did perform a good dissection within the  mediastinum to allow a complete reduction of the sac, gain esophageal length and a to completely allow an intra-abdominal fundoplication. Using two strips of Bio-A as pledgets we approximated the crus with a 2-0V lock suture. A bio-A 10x7 cm mesh was inserted and secured using Vistaseal.   We Asked anesthesia to place a 50 French bougie and this went easily.  We also observe trajectory of the bougie. 360 degree Nissen fundoplication was created with multiple 2-0 silk sutures and we placed 3 stitches taking some of the esophagus within that bite.  The fundoplication measured approximately 3-1/2 cm and he was floppy. I was very happy with the way the fundoplication laid and the repair of the hernia.  Inspection of the  upper quadrant was performed. No bleeding, bile  Or esophageal injuries leaks, or bowel injuries were noted. Robotic instruments and robotic arms were undocked in the standard fashion. All the needles were removed under direct visualization.   I scrubbed back in.  Pneumoperitoneum was released.  The periumbilical port site was closed with interrumpted 0 Vicryl sutures. 4-0 subcuticular Monocryl was used to close the skin. Liposomal marcaine was injected to all the incisions sites.  Dermabond was  applied.  The patient was then extubated and brought to the recovery room in stable condition. Sponge, lap, and needle counts were correct at closure and at the conclusion of the case.               Sterling Big, MD, FACS

## 2023-02-07 NOTE — Anesthesia Preprocedure Evaluation (Signed)
Anesthesia Evaluation  Patient identified by MRN, date of birth, ID band Patient awake    Reviewed: Allergy & Precautions, NPO status , Patient's Chart, lab work & pertinent test results  History of Anesthesia Complications Negative for: history of anesthetic complications  Airway Mallampati: II  TM Distance: >3 FB Neck ROM: Full    Dental no notable dental hx. (+) Teeth Intact   Pulmonary neg pulmonary ROS, neg sleep apnea, neg COPD, Patient abstained from smoking.Not current smoker   Pulmonary exam normal breath sounds clear to auscultation       Cardiovascular Exercise Tolerance: Good METS(-) hypertension(-) CAD and (-) Past MI negative cardio ROS (-) dysrhythmias  Rhythm:Regular Rate:Normal - Systolic murmurs    Neuro/Psych  PSYCHIATRIC DISORDERS Anxiety Depression    negative neurological ROS     GI/Hepatic ,GERD  Poorly Controlled,,(+)     (-) substance abuse    Endo/Other  neg diabetes    Renal/GU negative Renal ROS     Musculoskeletal   Abdominal  (+) + obese  Peds  Hematology   Anesthesia Other Findings Past Medical History: No date: Anxiety No date: Depression No date: GERD (gastroesophageal reflux disease) No date: Heart murmur     Comment:  at birth.  Resolved No date: Obesity (BMI 35.0-39.9 without comorbidity) No date: Scoliosis  Reproductive/Obstetrics                             Anesthesia Physical Anesthesia Plan  ASA: 2  Anesthesia Plan: General   Post-op Pain Management: Tylenol PO (pre-op)*, Celebrex PO (pre-op)*, Gabapentin PO (pre-op)* and Dilaudid IV   Induction: Intravenous and Rapid sequence  PONV Risk Score and Plan: 4 or greater and Ondansetron, Dexamethasone, Midazolam and Scopolamine patch - Pre-op  Airway Management Planned: Oral ETT  Additional Equipment: None  Intra-op Plan:   Post-operative Plan: Extubation in OR  Informed Consent: I  have reviewed the patients History and Physical, chart, labs and discussed the procedure including the risks, benefits and alternatives for the proposed anesthesia with the patient or authorized representative who has indicated his/her understanding and acceptance.     Dental advisory given  Plan Discussed with: CRNA and Surgeon  Anesthesia Plan Comments: (Discussed risks of anesthesia with patient, including PONV, sore throat, lip/dental/eye damage. Rare risks discussed as well, such as cardiorespiratory and neurological sequelae, and allergic reactions. Discussed the role of CRNA in patient's perioperative care. Patient understands.)       Anesthesia Quick Evaluation

## 2023-02-07 NOTE — Progress Notes (Signed)
Patient is doing well. Resting comfortably in bed. Updated family that she is doing well, and that we are awaiting a room assignment.

## 2023-02-07 NOTE — Anesthesia Postprocedure Evaluation (Signed)
Anesthesia Post Note  Patient: Sharon Kelly  Procedure(s) Performed: XI ROBOTIC ASSISTED PARAESOPHAGEAL HERNIA REPAIR, RNFA to assist INSERTION OF MESH  Patient location during evaluation: PACU Anesthesia Type: General Level of consciousness: awake and alert Pain management: pain level controlled Vital Signs Assessment: post-procedure vital signs reviewed and stable Respiratory status: spontaneous breathing, nonlabored ventilation, respiratory function stable and patient connected to nasal cannula oxygen Cardiovascular status: blood pressure returned to baseline and stable Postop Assessment: no apparent nausea or vomiting Anesthetic complications: no   No notable events documented.   Last Vitals:  Vitals:   02/07/23 1056 02/07/23 1100  BP:  131/71  Pulse: (!) 113 (!) 113  Resp: 12 10  Temp:    SpO2: 92% 92%    Last Pain:  Vitals:   02/07/23 1056  TempSrc:   PainSc: Asleep                 Corinda Gubler

## 2023-02-08 DIAGNOSIS — K449 Diaphragmatic hernia without obstruction or gangrene: Secondary | ICD-10-CM | POA: Diagnosis not present

## 2023-02-08 LAB — BASIC METABOLIC PANEL
Anion gap: 8 (ref 5–15)
BUN: 7 mg/dL (ref 6–20)
CO2: 22 mmol/L (ref 22–32)
Calcium: 8 mg/dL — ABNORMAL LOW (ref 8.9–10.3)
Chloride: 105 mmol/L (ref 98–111)
Creatinine, Ser: 0.64 mg/dL (ref 0.44–1.00)
GFR, Estimated: >60 mL/min (ref >60)
Glucose, Bld: 99 mg/dL (ref 70–99)
Potassium: 3.8 mmol/L (ref 3.5–5.1)
Sodium: 135 mmol/L (ref 135–145)

## 2023-02-08 LAB — CBC
HCT: 36.6 % (ref 36.0–46.0)
Hemoglobin: 11.8 g/dL — ABNORMAL LOW (ref 12.0–15.0)
MCH: 26.9 pg (ref 26.0–34.0)
MCHC: 32.2 g/dL (ref 30.0–36.0)
MCV: 83.6 fL (ref 80.0–100.0)
Platelets: 294 10*3/uL (ref 150–400)
RBC: 4.38 MIL/uL (ref 3.87–5.11)
RDW: 13.7 % (ref 11.5–15.5)
WBC: 13.7 10*3/uL — ABNORMAL HIGH (ref 4.0–10.5)
nRBC: 0 % (ref 0.0–0.2)

## 2023-02-08 LAB — HIV ANTIBODY (ROUTINE TESTING W REFLEX): HIV Screen 4th Generation wRfx: NONREACTIVE

## 2023-02-08 LAB — MAGNESIUM: Magnesium: 1.9 mg/dL (ref 1.7–2.4)

## 2023-02-08 LAB — PHOSPHORUS: Phosphorus: 3.7 mg/dL (ref 2.5–4.6)

## 2023-02-08 MED ORDER — ONDANSETRON 4 MG PO TBDP: 4.0000 mg | ORAL_TABLET | Freq: Four times a day (QID) | ORAL | 0 refills | Status: AC | PRN

## 2023-02-08 MED ORDER — OXYCODONE HCL 5 MG PO TABS: 5.0000 mg | ORAL_TABLET | Freq: Four times a day (QID) | ORAL | 0 refills | Status: DC | PRN

## 2023-02-08 MED ORDER — IBUPROFEN 600 MG PO TABS: 600.0000 mg | ORAL_TABLET | Freq: Four times a day (QID) | ORAL | 0 refills | Status: AC | PRN

## 2023-02-08 NOTE — Plan of Care (Signed)
  Problem: Education: Goal: Knowledge of General Education information will improve Description: Including pain rating scale, medication(s)/side effects and non-pharmacologic comfort measures Outcome: Progressing   Problem: Health Behavior/Discharge Planning: Goal: Ability to manage health-related needs will improve Outcome: Progressing   Problem: Clinical Measurements: Goal: Will remain free from infection Outcome: Progressing   Problem: Clinical Measurements: Goal: Respiratory complications will improve 02/08/2023 0036 by Myles Gip, RN Outcome: Progressing 02/07/2023 2350 by Myles Gip, RN Outcome: Progressing   Problem: Clinical Measurements: Goal: Cardiovascular complication will be avoided Outcome: Progressing   Problem: Nutrition: Goal: Adequate nutrition will be maintained Outcome: Progressing   Problem: Pain Managment: Goal: General experience of comfort will improve Outcome: Progressing   Problem: Safety: Goal: Ability to remain free from injury will improve Outcome: Progressing

## 2023-02-08 NOTE — Plan of Care (Signed)
  Problem: Education: Goal: Knowledge of General Education information will improve Description: Including pain rating scale, medication(s)/side effects and non-pharmacologic comfort measures 02/08/2023 1036 by Danford Bad, RN Outcome: Progressing 02/08/2023 0851 by Danford Bad, RN Outcome: Progressing   Problem: Health Behavior/Discharge Planning: Goal: Ability to manage health-related needs will improve 02/08/2023 1036 by Danford Bad, RN Outcome: Progressing 02/08/2023 0851 by Danford Bad, RN Outcome: Progressing   Problem: Clinical Measurements: Goal: Ability to maintain clinical measurements within normal limits will improve 02/08/2023 1036 by Danford Bad, RN Outcome: Progressing 02/08/2023 0851 by Danford Bad, RN Outcome: Progressing Goal: Will remain free from infection 02/08/2023 1036 by Danford Bad, RN Outcome: Progressing 02/08/2023 0851 by Danford Bad, RN Outcome: Progressing Goal: Diagnostic test results will improve 02/08/2023 1036 by Danford Bad, RN Outcome: Progressing 02/08/2023 0851 by Danford Bad, RN Outcome: Progressing Goal: Respiratory complications will improve 02/08/2023 1036 by Danford Bad, RN Outcome: Progressing 02/08/2023 0851 by Danford Bad, RN Outcome: Progressing Goal: Cardiovascular complication will be avoided 02/08/2023 1036 by Danford Bad, RN Outcome: Progressing 02/08/2023 0851 by Danford Bad, RN Outcome: Progressing   Problem: Activity: Goal: Risk for activity intolerance will decrease 02/08/2023 1036 by Danford Bad, RN Outcome: Progressing 02/08/2023 0851 by Danford Bad, RN Outcome: Progressing   Problem: Nutrition: Goal: Adequate nutrition will be maintained 02/08/2023 1036 by Danford Bad, RN Outcome: Progressing 02/08/2023 0851 by Danford Bad, RN Outcome: Progressing   Problem: Coping: Goal: Level of anxiety will  decrease 02/08/2023 1036 by Danford Bad, RN Outcome: Progressing 02/08/2023 0851 by Danford Bad, RN Outcome: Progressing   Problem: Elimination: Goal: Will not experience complications related to bowel motility 02/08/2023 1036 by Danford Bad, RN Outcome: Progressing 02/08/2023 0851 by Danford Bad, RN Outcome: Progressing Goal: Will not experience complications related to urinary retention 02/08/2023 1036 by Danford Bad, RN Outcome: Progressing 02/08/2023 0851 by Danford Bad, RN Outcome: Progressing   Problem: Pain Managment: Goal: General experience of comfort will improve 02/08/2023 1036 by Danford Bad, RN Outcome: Progressing 02/08/2023 0851 by Danford Bad, RN Outcome: Progressing   Problem: Safety: Goal: Ability to remain free from injury will improve 02/08/2023 1036 by Danford Bad, RN Outcome: Progressing 02/08/2023 0851 by Danford Bad, RN Outcome: Progressing   Problem: Skin Integrity: Goal: Risk for impaired skin integrity will decrease 02/08/2023 1036 by Danford Bad, RN Outcome: Progressing 02/08/2023 0851 by Danford Bad, RN Outcome: Progressing

## 2023-02-08 NOTE — Discharge Summary (Signed)
[facility] SURGICAL DISCHARGE SUMMARY  Patient ID: Sharon Kelly MRN: [id number] DOB/AGE: Jan 30, 2001 22 y.o.  Admit date: 02/07/2023 Discharge date: 02/08/2023  Discharge Diagnoses Patient Active Problem List   Diagnosis Date Noted   S/P repair of paraesophageal hernia 02/07/2023   Hiatal hernia with GERD 12/21/2022   GAD (generalized anxiety disorder) 07/13/2019   Social anxiety disorder 07/13/2019   Major depressive disorder, recurrent episode, in full remission (HCC) 07/13/2019   Eructation 07/13/2015   Nausea 03/02/2015    Consultants None  Procedures 02/07/2023:  Robotic assisted laparoscopic paraesophageal hernia repair with Nissen Fundoplication   HPI: Sharon Kelly is a 22 y.o. female with history of recalcitrant reflux and type III paraesophageal hernia who presents to [facility] on 02/07/2023 for scheduled repair   Hospital Course: Informed consent was obtained and documented, and patient underwent uneventful robotic assisted laparoscopic paraesophageal hernia repair with Nissen Fundoplication (Dr Everlene Farrier, 02/07/2023).  Post-operatively, patient did well and advancement of patient's diet and ambulation were well-tolerated. The remainder of patient's hospital course was essentially unremarkable, and discharge planning was initiated accordingly with patient safely able to be discharged home with appropriate discharge instructions, pain control, and outpatient follow-up after all of her questions were answered to her expressed satisfaction.   Discharge Condition: Good   Physical Examination:  Constitutional: Well appearing female, NAD Pulmonary: Normal effort, no respiratory distress Gastrointestinal: Soft, incisional soreness, non-distended, no rebound/guarding Skin: Laparoscopic incisions are CDI with dermabond, no erythema    Allergies as of 02/08/2023   No Known Allergies      Medication List     TAKE these medications    APPLE CIDER VINEGAR  PO Take by mouth daily.   busPIRone 15 MG tablet Commonly known as: BUSPAR Take 15 mg by mouth 2 (two) times daily.   DULoxetine 60 MG capsule Commonly known as: CYMBALTA Take 1 capsule (60 mg total) by mouth 2 (two) times daily.   esomeprazole 40 MG capsule Commonly known as: NEXIUM Take 40 mg by mouth daily.   ibuprofen 600 MG tablet Commonly known as: ADVIL Take 1 tablet (600 mg total) by mouth every 6 (six) hours as needed.   loratadine 10 MG tablet Commonly known as: CLARITIN Take 10 mg by mouth daily.   Norethindrone-Ethinyl Estradiol-Fe Biphas 1 MG-10 MCG / 10 MCG tablet Commonly known as: LO LOESTRIN FE Take 1 tablet by mouth daily.   ondansetron 4 MG disintegrating tablet Commonly known as: ZOFRAN-ODT Take 1 tablet (4 mg total) by mouth every 6 (six) hours as needed for nausea.   oxyCODONE 5 MG immediate release tablet Commonly known as: Oxy IR/ROXICODONE Take 1 tablet (5 mg total) by mouth every 6 (six) hours as needed for severe pain or breakthrough pain.   traZODone 100 MG tablet Commonly known as: DESYREL Take 1 tablet (100 mg total) by mouth at bedtime as needed for sleep.   VALERIAN ROOT PO Take by mouth daily as needed.   Vitamin D3 50 MCG (2000 UT) capsule Take 2,000 Units by mouth daily.          Follow-up Information     Pabon, [place] F, MD. Schedule an appointment as soon as possible for a visit in 3 week(s).   Specialty: General Surgery Why: s/p paraesophageal hernia Contact information: [address] [place] [address] [phone]                  Time spent on discharge management including discussion of hospital  course, clinical condition, outpatient instructions, prescriptions, and follow up with the patient and members of the medical team: >30 minutes  -- Lynden Oxford , PA-C [facility]  02/08/2023, 8:35 AM [phone] M-F: 7am - 4pm

## 2023-02-08 NOTE — Progress Notes (Signed)
2 PIV removed. AVS discussed and provided to patient. Follow up appointment scheduled. Prescriptions sent to pharmacy. No complaints at this time.

## 2023-02-08 NOTE — Plan of Care (Signed)

## 2023-02-21 ENCOUNTER — Emergency Department
Admission: EM | Admit: 2023-02-21 | Discharge: 2023-02-21 | Disposition: A | Discharge status: Home / Self Care | Payer: BC Managed Care – PPO | Attending: Emergency Medicine | Admitting: Emergency Medicine

## 2023-02-21 ENCOUNTER — Other Ambulatory Visit: Payer: Self-pay

## 2023-02-21 DIAGNOSIS — Z5189 Encounter for other specified aftercare: Secondary | ICD-10-CM

## 2023-02-21 DIAGNOSIS — Z4801 Encounter for change or removal of surgical wound dressing: Secondary | ICD-10-CM | POA: Diagnosis present

## 2023-02-21 DIAGNOSIS — T8131XA Disruption of external operation (surgical) wound, not elsewhere classified, initial encounter: Secondary | ICD-10-CM | POA: Diagnosis not present

## 2023-02-21 NOTE — ED Provider Notes (Signed)
[facility] Provider Note    Event Date/Time   First MD Initiated Contact with Patient 02/21/23 0300     (approximate)   History   Post-op Problem   HPI  Sharon Kelly is a 22 y.o. female who presents to the ED from home with a chief complaint of postop issue.  Patient had paraesophageal hernia repair and Nissen fundoplication by Dr. Everlene Farrier on 02/07/2023.  No issues since discharge from the hospital.  Denies fever/chills, chest pain, shortness of breath, abdominal pain, nausea, vomiting or dizziness.  Last night her pants got stuck on one of her incisions and she pulled forcefully.  She also had difficulty swallowing a piece of food so coughed it up forcefully.  Her sister later noticed one of her incisions was open.  Patient denies pain.  She is urinating and defecating well as well as passing gas.     Past Medical History   Past Medical History:  Diagnosis Date   Anxiety    Depression    GERD (gastroesophageal reflux disease)    Heart murmur    at birth.  Resolved   Obesity (BMI 35.0-39.9 without comorbidity)    Scoliosis      Active Problem List   Patient Active Problem List   Diagnosis Date Noted   S/P repair of paraesophageal hernia 02/07/2023   Hiatal hernia with GERD 12/21/2022   GAD (generalized anxiety disorder) 07/13/2019   Social anxiety disorder 07/13/2019   Major depressive disorder, recurrent episode, in full remission (HCC) 07/13/2019   Eructation 07/13/2015   Nausea 03/02/2015     Past Surgical History   Past Surgical History:  Procedure Laterality Date   CHOLECYSTECTOMY N/A 01/11/2015   Procedure: LAPAROSCOPIC CHOLECYSTECTOMY WITH INTRAOPERATIVE CHOLANGIOGRAM;  Surgeon: Earline Mayotte, MD;  Location: [facility] ORS;  Service: General;  Laterality: N/A;   ESOPHAGOGASTRODUODENOSCOPY (EGD) WITH PROPOFOL N/A 12/21/2022   Procedure: ESOPHAGOGASTRODUODENOSCOPY (EGD) WITH PROPOFOL;  Surgeon: Midge Minium, MD;  Location: [facility];  Service: Endoscopy;  Laterality: N/A;   INSERTION OF MESH  02/07/2023   Procedure: INSERTION OF MESH;  Surgeon: Leafy Ro, MD;  Location: [facility] ORS;  Service: General;;   WISDOM TOOTH EXTRACTION     XI ROBOTIC ASSISTED PARAESOPHAGEAL HERNIA REPAIR N/A 02/07/2023   Procedure: XI ROBOTIC ASSISTED PARAESOPHAGEAL HERNIA REPAIR, RNFA to assist;  Surgeon: Leafy Ro, MD;  Location: [facility] ORS;  Service: General;  Laterality: N/A;     Home Medications   Prior to Admission medications   Medication Sig Start Date End Date Taking? Authorizing Provider  APPLE CIDER VINEGAR PO Take by mouth daily.    [provider]  busPIRone (BUSPAR) 15 MG tablet Take 15 mg by mouth 2 (two) times daily. 10/08/22   [provider]  Cholecalciferol (VITAMIN D3) 50 MCG (2000 UT) CAPS Take 2,000 Units by mouth daily.    [provider]  DULoxetine (CYMBALTA) 60 MG capsule Take 1 capsule (60 mg total) by mouth 2 (two) times daily. 10/26/20 02/07/23  Pucilowski, Roosvelt Maser, MD  esomeprazole (NEXIUM) 40 MG capsule Take 40 mg by mouth daily. 09/05/22   [provider]  ibuprofen (ADVIL) 600 MG tablet Take 1 tablet (600 mg total) by mouth every 6 (six) hours as needed. 02/08/23   Donovan Kail, PA-C  loratadine (CLARITIN) 10 MG tablet Take 10 mg by mouth daily.    [provider]  Norethindrone-Ethinyl Estradiol-Fe Biphas (LO LOESTRIN FE) 1 MG-10 MCG /  10 MCG tablet Take 1 tablet by mouth daily.    [provider]  ondansetron (ZOFRAN-ODT) 4 MG disintegrating tablet Take 1 tablet (4 mg total) by mouth every 6 (six) hours as needed for nausea. 02/08/23   Donovan Kail, PA-C  oxyCODONE (OXY IR/ROXICODONE) 5 MG immediate release tablet Take 1 tablet (5 mg total) by mouth every 6 (six) hours as needed for severe pain or breakthrough pain. 02/08/23   Donovan Kail, PA-C  traZODone (DESYREL) 100 MG tablet Take 1 tablet (100 mg total) by mouth at bedtime as  needed for sleep. 10/26/20 02/07/23  Pucilowski, Roosvelt Maser, MD  VALERIAN ROOT PO Take by mouth daily as needed.    [provider]     Allergies  Patient has no known allergies.   Family History   Family History  Problem Relation Age of Onset   Diabetes Mother    Cholelithiasis Mother    Depression Mother    Cholelithiasis Paternal Grandfather    Depression Sister    Anxiety disorder Sister    Anxiety disorder Maternal Aunt    Depression Maternal Aunt      Physical Exam  Triage Vital Signs: ED Triage Vitals  Enc Vitals Group     BP 02/21/23 0021 (!) 140/91     Pulse Rate 02/21/23 0021 93     Resp 02/21/23 0021 16     Temp 02/21/23 0021 98.5 F (36.9 C)     Temp Source 02/21/23 0021 Oral     SpO2 02/21/23 0021 100 %     Weight 02/21/23 0023 175 lb (79.4 kg)     Height 02/21/23 0023 5' (1.524 m)     Head Circumference --      Peak Flow --      Pain Score 02/21/23 0034 7     Pain Loc --      Pain Edu? --      Excl. in GC? --     Updated Vital Signs: BP (!) 140/91 (BP Location: Left Arm)   Pulse 93   Temp 98.5 F (36.9 C) (Oral)   Resp 16   Ht 5' (1.524 m)   Wt 79.4 kg   LMP 01/15/2023 (Approximate)   SpO2 100%   BMI 34.18 kg/m    General: Awake, no distress.  CV:  RRR.  Good peripheral perfusion.  Resp:  Normal effort.  CTAB. Abd:  Nontender.  Postoperative incisions clean dry intact.  Second left most incision with 0.5 cm area of wound dehiscence.  Unable to probe with a Q-tip due to tiny area in question.  No purulence expressed.  No pain on palpation.  No distention.  Other:     ED Results / Procedures / Treatments  Labs (all labs ordered are listed, but only abnormal results are displayed) Labs Reviewed - No data to display   EKG  None   RADIOLOGY None   Official radiology report(s): No results found.   PROCEDURES:  Critical Care performed: No  Procedures   MEDICATIONS ORDERED IN ED: Medications - No data to  display   IMPRESSION / MDM / ASSESSMENT AND PLAN / ED COURSE  I reviewed the triage vital signs and the nursing notes.                             22 year old female presenting with wound dehiscence.  Discussed with patient and mother and offered evaluation with lab  work and advanced imaging.  Given that she is afebrile, nontender and otherwise would not have presented to the emergency department other than the fact that her sister noted the wound dehiscence, they are comfortable holding off for now.  Will cleanse area in question, apply Steri-Strip.  Have emailed her surgeon to let him know she was in the emergency department tonight.  Patient has her postoperative appointment next week.  Strict return precautions given.  Patient and mother verbalized understanding agree with plan of care.  Patient's presentation is most consistent with acute, uncomplicated illness.  FINAL CLINICAL IMPRESSION(S) / ED DIAGNOSES   Final diagnoses:  Visit for wound check     Rx / DC Orders   ED Discharge Orders     None        Note:  This document was prepared using Dragon voice recognition software and may include unintentional dictation errors.   Irean Hong, MD 02/21/23 [phone]

## 2023-02-21 NOTE — ED Notes (Signed)
Steristrips applied to RLQ insicion.

## 2023-02-21 NOTE — Discharge Instructions (Addendum)
You may remove Steri-Strip if it is still attached in 7 to 10 days.  Do not soak the area.  Return to the ER for worsening symptoms, increased redness/swelling, purulent discharge or other concerns.

## 2023-02-21 NOTE — ED Triage Notes (Addendum)
Pt presents to ER with post op issue following a paraesophageal hernia repair on 6/20.  Pt states tonight, she noticed her incision starting to open up.  Pt does have open incision noted to right mid abdomen area.  No bleeding or drainage noted on assessment.  Pt states she does have some pain around incision area, but states it is worse with movement.  Pt is otherwise A&O x4 and in NAD in triage.

## 2023-02-25 ENCOUNTER — Ambulatory Visit (INDEPENDENT_AMBULATORY_CARE_PROVIDER_SITE_OTHER): Payer: BC Managed Care – PPO | Admitting: Surgery

## 2023-02-25 ENCOUNTER — Encounter: Payer: Self-pay | Admitting: Surgery

## 2023-02-25 VITALS — BP 127/88 | HR 108 | Temp 98.7°F | Ht 60.0 in | Wt 172.2 lb

## 2023-02-25 DIAGNOSIS — K449 Diaphragmatic hernia without obstruction or gangrene: Secondary | ICD-10-CM

## 2023-02-25 DIAGNOSIS — Z09 Encounter for follow-up examination after completed treatment for conditions other than malignant neoplasm: Secondary | ICD-10-CM

## 2023-02-25 NOTE — Patient Instructions (Signed)
If you have any concerns or questions, please feel free to call our office. Follow up in 3 months.   Eating Plan After Nissen Fundoplication After a Nissen fundoplication procedure, it is common to have some difficulty swallowing. The part of your body that moves food and liquid from your mouth to your stomach (esophagus) will be swollen and may feel tight. It will take several weeks or months for your esophagus and stomach to heal. By following a special eating plan, you can prevent problems such as pain, swelling or pressure in the abdomen (bloating), gas, nausea, or diarrhea. What are tips for following this plan? Cooking Cook all foods until they are soft. Remove skins and seeds from fruits and vegetables before eating. Remove skin and gristle from meats. Grind or finely mince meats before eating. Avoid over-cooking meat. Dry, tough meat is more difficult to swallow. Avoid using oil when cooking, or use only a small amount of oil. Avoid using seasoning when cooking, or use only a small amount of seasoning. Toast bread before eating. This makes it easier to swallow. Meal planning  Eat 6-8 small meals throughout the day. Right after the surgery, have a few meals that are only clear liquids. Clear liquids include: Water. Clear fruit juice, no pulp. Chicken, beef, or vegetable broth. Gelatin. Decaffeinated tea or coffee without milk. Popsicles or shaved ice. Depending on your progress, you may move to a full liquid diet as told by your health care provider. This includes clear liquids and the following: Dairy and alternative milks, such as soy milk. Strained creamed soups. Ice cream or sherbet. Pudding. Nutritional supplement drinks. Yogurt. A few days after surgery, you may be able to start eating a diet of soft foods. You may need to eat according to this plan for several weeks. Do not eat sweets or sweetened drinks at the beginning of a meal. Doing that may cause your stomach to  empty faster than it should (dumping syndrome). Lifestyle Always sit upright when eating or drinking. Eat slowly. Take small bites and chew food well before swallowing. Do not lie down after eating. Stay sitting up for 30 minutes or longer after each meal. Sip fluids between meals. Limit how much you drink at one time. With meals and snacks, have 4-8 oz (120-240 mL). This is equal to  cup-1 cup. Do not mix solid foods and liquids in the same mouthful. Drink enough fluid to keep your urine pale yellow. Do not chew gum or drink fluids through a straw. Doing those things may cause you to swallow extra air. General information Do not drink carbonated drinks or alcohol. Avoid foods and drinks that contain caffeine and chocolate. Avoid foods and drinks that contain citrus or tomato. Allow hot soups and drinks to cool before eating. Avoid foods that cause gas, such as beans, peas, broccoli, or cabbage. If dairy milk products cause diarrhea, avoid them or eat them in small amounts. Recommended foods Fruits Any soft-cooked fruits after skins and seeds are removed. Fruit juice. Vegetables Any soft-cooked vegetables after skins and seeds are removed. Vegetable juice. Grains Cooked cereals. Dry cereals softened with liquid. Cooked pasta, rice, or other grains. Toasted bread. Bland crackers, such as soda or graham crackers. Meats and other protein foods Tender cuts of meat, poultry, or fish after bones, skin, and gristle are removed. Poached, boiled, or scrambled eggs. Canned fish. Tofu. Creamy nut butters. Dairy Milk. Yogurt. Cottage cheese. Mild cheeses. Beverages Nutritional supplement drinks. Decaffeinated tea or coffee. Sports drinks.  Fats and oils Butter. Margarine. Mayonnaise. Vegetable oil. Smooth salad dressing. Sweets and desserts Plain hard candy. Marshmallows. Pudding. Ice cream. Gelatin. Sherbet. Seasoning and other foods Salt. Light seasonings. Mustard. Vinegar. The items listed  above may not be a complete list of recommended foods and beverages. Contact a dietitian for more information. Foods to avoid Fruits Oranges. Grapefruit. Lemons. Limes. Citrus juices. Dried fruit. Crunchy, raw fruits. Vegetables Tomato sauce. Tomato juice. Broccoli. Cauliflower. Cabbage. Brussels sprouts. Crunchy, raw vegetables. Grains High-fiber or bran cereal. Cereal with nuts, dried fruit, or coconut. Sweet breads, rolls, coffee cake, or donuts. Chewy or crusty breads. Popcorn. Meats and other protein foods Beans, peas, and lentils. Tough or fatty meats. Fried meats, chicken, or fish. Fried eggs. Nuts and seeds. Crunchy nut butters. Dairy Chocolate milk. Yogurt with chunks of fruit, nuts, seeds, or coconut. Strong cheeses. Beverages Carbonated soft drinks. Alcohol. Cocoa. Hot drinks. Fats and oils Bacon fat. Lard. Sweets and desserts Chocolate. Candy with nuts, coconut, or seeds. Peppermint. Cookies. Cakes. Pie crust. Seasoning and other foods Heavy seasonings. Chili sauce. Ketchup. Barbecue sauce. Rosita Fire. Horseradish. The items listed above may not be a complete list of foods and beverages to avoid. Contact a dietitian for more information. Summary Following this eating plan after a Nissen fundoplication is an important part of healing after surgery. After surgery, you will start with a clear liquid diet before you progress to full liquids and soft foods. You may need to eat soft foods for several weeks. Avoid eating foods that cause irritation, gas, nausea, diarrhea, or swelling or pressure in the abdomen (bloating), and avoid foods that are difficult to swallow. Talk with a dietitian about which dietary choices are best for you. This information is not intended to replace advice given to you by your health care provider. Make sure you discuss any questions you have with your health care provider. Document Revised: 02/21/2020 Document Reviewed: 02/21/2020 Elsevier Patient Education   2024 [employer].

## 2023-02-27 ENCOUNTER — Encounter: Payer: BC Managed Care – PPO | Admitting: Surgery

## 2023-02-27 NOTE — Progress Notes (Signed)
Sharon Kelly is 2 weeks out from robotic Nissen fundoplication and hiatal hernia repair.  She is doing very well.  No fevers no chills did have a small and very superficial wound dehiscence on the right side.  She denies any reflux and feels much better.  No evidence of dysphagia.  She is tolerating diet  PE NAD Abd: Soft nontender there is small 1 cm very superficial dehiscence on the right lower quadrant.  There is no evidence of infection.  I placed a Band-Aid.  All the other incisions are healing well   A/P doing very well small very superficial wound dehiscence.  May do Band-Aid.  Not infected does not need packing. Continue Nissen diet.  I will see her back in a couple months.

## 2023-06-03 ENCOUNTER — Encounter: Payer: Self-pay | Admitting: Surgery

## 2023-06-03 ENCOUNTER — Ambulatory Visit (INDEPENDENT_AMBULATORY_CARE_PROVIDER_SITE_OTHER): Payer: BC Managed Care – PPO | Admitting: Surgery

## 2023-06-03 VITALS — BP 128/83 | HR 98 | Temp 97.9°F | Ht 60.0 in | Wt 173.0 lb

## 2023-06-03 DIAGNOSIS — K449 Diaphragmatic hernia without obstruction or gangrene: Secondary | ICD-10-CM

## 2023-06-03 DIAGNOSIS — K219 Gastro-esophageal reflux disease without esophagitis: Secondary | ICD-10-CM | POA: Diagnosis not present

## 2023-06-03 DIAGNOSIS — Z09 Encounter for follow-up examination after completed treatment for conditions other than malignant neoplasm: Secondary | ICD-10-CM

## 2023-06-03 NOTE — Patient Instructions (Signed)
Follow-up with our office as needed.  Please call and ask to speak with a nurse if you develop questions or concerns.

## 2023-06-03 NOTE — Progress Notes (Signed)
Outpatient Surgical Follow Up  06/03/2023  Sharon Kelly is an 22 y.o. female.   Chief Complaint  Patient presents with   Follow-up    HPI: Sharon Kelly 22 year old female 3 and half months from Nissen fundoplication.  She is doing very well.  No dysphagia no reflux.  She is off PPI.  She is very happy with outcome.  She Did lose about 12 pounds or so.  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 Some atypical right sided chest pain, likely MSK.  Past Medical History:  Diagnosis Date   Anxiety    Depression    GERD (gastroesophageal reflux disease)    Heart murmur    at birth.  Resolved   Obesity (BMI 35.0-39.9 without comorbidity)    Scoliosis     Past Surgical History:  Procedure Laterality Date   CHOLECYSTECTOMY N/A 01/11/2015   Procedure: LAPAROSCOPIC CHOLECYSTECTOMY WITH INTRAOPERATIVE CHOLANGIOGRAM;  Surgeon: Earline Mayotte, MD;  Location: [facility];  Service: General;  Laterality: N/A;   ESOPHAGOGASTRODUODENOSCOPY (EGD) WITH PROPOFOL N/A 12/21/2022   Procedure: ESOPHAGOGASTRODUODENOSCOPY (EGD) WITH PROPOFOL;  Surgeon: Midge Minium, MD;  Location: [facility];  Service: Endoscopy;  Laterality: N/A;   INSERTION OF MESH  02/07/2023   Procedure: INSERTION OF MESH;  Surgeon: Leafy Ro, MD;  Location: [facility];  Service: General;;   WISDOM TOOTH EXTRACTION     XI ROBOTIC ASSISTED PARAESOPHAGEAL HERNIA REPAIR N/A 02/07/2023   Procedure: XI ROBOTIC ASSISTED PARAESOPHAGEAL HERNIA REPAIR, RNFA to assist;  Surgeon: Leafy Ro, MD;  Location: [facility];  Service: General;  Laterality: N/A;    Family History  Problem Relation Age of Onset   Diabetes Mother    Cholelithiasis Mother    Depression Mother    Cholelithiasis Paternal Grandfather    Depression Sister    Anxiety disorder Sister    Anxiety disorder Maternal Aunt    Depression Maternal Aunt     Social History:  reports that she has never smoked. She has never been exposed to tobacco smoke. She has never  used smokeless tobacco. She reports current alcohol use. She reports that she does not use drugs.  Allergies: No Known Allergies  Medications reviewed.    ROS Full ROS performed and is otherwise negative other than what is stated in HPI   BP 128/83   Pulse 98   Temp 97.9 F (36.6 C)   Ht 5' (1.524 m)   Wt 173 lb (78.5 kg)   SpO2 99%   BMI 33.79 kg/m   Physical Exam NAD alert EYES: Pupils are equal, round, Sclera are non-icteric. EARS, NOSE, MOUTH AND THROAT: The oropharynx is clear. The oral mucosa is pink and moist. Hearing is intact to voice. LYMPH NODES:  Lymph nodes in the neck are normal. RESPIRATORY:  Lungs are clear. There is normal respiratory effort, with equal breath sounds bilaterally, and without pathologic use of accessory muscles. CARDIOVASCULAR: Heart is regular without murmurs, gallops, or rubs. GI: The abdomen is  soft, nontender, and nondistended. Scars well healed, there is no hepatosplenomegaly. There are normal bowel sounds  GU: Rectal deferred.   MUSCULOSKELETAL: Normal muscle strength and tone. No cyanosis or edema.   SKIN: Turgor is good and there are no pathologic skin lesions or ulcers. NEUROLOGIC: Motor and sensation is grossly normal. Cranial nerves are grossly intact. PSYCH:  Oriented to person, place and time. Affect is normal.  Assessment/Plan: Very well after Nissen fundoplication.  Good reflux control no swallowing issues.  We  Will follow her on a as needed basis.  Sterling Big, MD [facility] General Surgeon

## 2023-06-05 ENCOUNTER — Ambulatory Visit: Payer: BC Managed Care – PPO | Admitting: Surgery

## 2024-05-29 ENCOUNTER — Other Ambulatory Visit: Payer: Self-pay | Admitting: Family Medicine

## 2024-05-29 DIAGNOSIS — R109 Unspecified abdominal pain: Secondary | ICD-10-CM

## 2024-06-01 ENCOUNTER — Ambulatory Visit
Admission: RE | Admit: 2024-06-01 | Discharge: 2024-06-01 | Disposition: A | Discharge status: Home / Self Care | Source: Ambulatory Visit | Attending: Family Medicine | Admitting: Family Medicine

## 2024-06-01 DIAGNOSIS — R109 Unspecified abdominal pain: Secondary | ICD-10-CM | POA: Insufficient documentation
